# Patient Record
Sex: Female | Born: 1940 | Race: White | Hispanic: No | Smoking: Former smoker
Health system: Southern US, Community
[De-identification: ages and names within clinical notes are randomized; demographics above are authoritative.]

## PROBLEM LIST (undated history)

## (undated) DIAGNOSIS — E041 Nontoxic single thyroid nodule: Secondary | ICD-10-CM

## (undated) DIAGNOSIS — D649 Anemia, unspecified: Secondary | ICD-10-CM

## (undated) DIAGNOSIS — E785 Hyperlipidemia, unspecified: Secondary | ICD-10-CM

## (undated) DIAGNOSIS — E0590 Thyrotoxicosis, unspecified without thyrotoxic crisis or storm: Secondary | ICD-10-CM

## (undated) DIAGNOSIS — I10 Essential (primary) hypertension: Secondary | ICD-10-CM

## (undated) DIAGNOSIS — I4891 Unspecified atrial fibrillation: Secondary | ICD-10-CM

## (undated) DIAGNOSIS — IMO0002 Reserved for concepts with insufficient information to code with codable children: Secondary | ICD-10-CM

## (undated) HISTORY — DX: Nontoxic single thyroid nodule: E04.1

## (undated) HISTORY — DX: Anemia, unspecified: D64.9

## (undated) HISTORY — PX: KNEE SURGERY: SHX244

## (undated) HISTORY — PX: ABDOMINAL HYSTERECTOMY: SHX81

## (undated) HISTORY — DX: Hyperlipidemia, unspecified: E78.5

## (undated) HISTORY — PX: ABDOMINAL EXPLORATION SURGERY: SHX538

## (undated) HISTORY — PX: COLON SURGERY: SHX602

## (undated) HISTORY — PX: CHOLECYSTECTOMY: SHX55

## (undated) HISTORY — PX: INCISE AND DRAIN ABCESS: PRO64

## (undated) HISTORY — PX: DILATION AND CURETTAGE OF UTERUS: SHX78

## (undated) HISTORY — PX: APPENDECTOMY: SHX54

## (undated) HISTORY — DX: Thyrotoxicosis, unspecified without thyrotoxic crisis or storm: E05.90

## (undated) HISTORY — DX: Reserved for concepts with insufficient information to code with codable children: IMO0002

## (undated) HISTORY — DX: Essential (primary) hypertension: I10

---

## 1999-06-15 ENCOUNTER — Encounter: Payer: Self-pay | Admitting: Emergency Medicine

## 1999-06-15 ENCOUNTER — Inpatient Hospital Stay (HOSPITAL_COMMUNITY): Admission: EM | Admit: 1999-06-15 | Discharge: 1999-06-18 | Payer: Self-pay | Admitting: Emergency Medicine

## 1999-06-24 ENCOUNTER — Encounter: Admission: RE | Admit: 1999-06-24 | Discharge: 1999-08-21 | Payer: Self-pay | Admitting: Orthopedic Surgery

## 1999-11-12 ENCOUNTER — Other Ambulatory Visit: Admission: RE | Admit: 1999-11-12 | Discharge: 1999-11-12 | Payer: Self-pay | Admitting: *Deleted

## 1999-11-18 ENCOUNTER — Encounter: Payer: Self-pay | Admitting: Cardiology

## 1999-11-18 ENCOUNTER — Encounter: Admission: RE | Admit: 1999-11-18 | Discharge: 1999-11-18 | Payer: Self-pay | Admitting: Cardiology

## 2000-11-18 ENCOUNTER — Encounter: Payer: Self-pay | Admitting: *Deleted

## 2000-11-18 ENCOUNTER — Encounter: Admission: RE | Admit: 2000-11-18 | Discharge: 2000-11-18 | Payer: Self-pay | Admitting: *Deleted

## 2000-12-13 ENCOUNTER — Other Ambulatory Visit: Admission: RE | Admit: 2000-12-13 | Discharge: 2000-12-13 | Payer: Self-pay | Admitting: *Deleted

## 2001-11-21 ENCOUNTER — Encounter: Payer: Self-pay | Admitting: *Deleted

## 2001-11-21 ENCOUNTER — Encounter: Admission: RE | Admit: 2001-11-21 | Discharge: 2001-11-21 | Payer: Self-pay | Admitting: *Deleted

## 2002-02-13 ENCOUNTER — Other Ambulatory Visit: Admission: RE | Admit: 2002-02-13 | Discharge: 2002-02-13 | Payer: Self-pay | Admitting: *Deleted

## 2002-11-22 ENCOUNTER — Encounter: Payer: Self-pay | Admitting: *Deleted

## 2002-11-22 ENCOUNTER — Encounter: Admission: RE | Admit: 2002-11-22 | Discharge: 2002-11-22 | Payer: Self-pay | Admitting: *Deleted

## 2002-12-08 ENCOUNTER — Encounter: Admission: RE | Admit: 2002-12-08 | Discharge: 2002-12-08 | Payer: Self-pay | Admitting: *Deleted

## 2002-12-08 ENCOUNTER — Encounter: Payer: Self-pay | Admitting: *Deleted

## 2003-03-06 ENCOUNTER — Other Ambulatory Visit: Admission: RE | Admit: 2003-03-06 | Discharge: 2003-03-06 | Payer: Self-pay | Admitting: *Deleted

## 2003-12-17 ENCOUNTER — Encounter: Admission: RE | Admit: 2003-12-17 | Discharge: 2003-12-17 | Payer: Self-pay | Admitting: Internal Medicine

## 2004-03-17 ENCOUNTER — Other Ambulatory Visit: Admission: RE | Admit: 2004-03-17 | Discharge: 2004-03-17 | Payer: Self-pay | Admitting: *Deleted

## 2004-12-25 ENCOUNTER — Encounter: Admission: RE | Admit: 2004-12-25 | Discharge: 2004-12-25 | Payer: Self-pay | Admitting: *Deleted

## 2005-03-10 ENCOUNTER — Other Ambulatory Visit: Admission: RE | Admit: 2005-03-10 | Discharge: 2005-03-10 | Payer: Self-pay | Admitting: *Deleted

## 2006-09-21 ENCOUNTER — Other Ambulatory Visit: Admission: RE | Admit: 2006-09-21 | Discharge: 2006-09-21 | Payer: Self-pay | Admitting: *Deleted

## 2006-09-23 ENCOUNTER — Encounter: Admission: RE | Admit: 2006-09-23 | Discharge: 2006-09-23 | Payer: Self-pay | Admitting: Internal Medicine

## 2007-09-26 ENCOUNTER — Encounter: Admission: RE | Admit: 2007-09-26 | Discharge: 2007-09-26 | Payer: Self-pay | Admitting: Internal Medicine

## 2007-09-28 ENCOUNTER — Other Ambulatory Visit: Admission: RE | Admit: 2007-09-28 | Discharge: 2007-09-28 | Payer: Self-pay | Admitting: *Deleted

## 2007-11-14 ENCOUNTER — Other Ambulatory Visit: Admission: RE | Admit: 2007-11-14 | Discharge: 2007-11-14 | Payer: Self-pay | Admitting: *Deleted

## 2008-03-23 ENCOUNTER — Inpatient Hospital Stay (HOSPITAL_COMMUNITY): Admission: EM | Admit: 2008-03-23 | Discharge: 2008-03-25 | Payer: Self-pay | Admitting: Emergency Medicine

## 2008-09-26 ENCOUNTER — Encounter: Admission: RE | Admit: 2008-09-26 | Discharge: 2008-09-26 | Payer: Self-pay | Admitting: Internal Medicine

## 2008-10-01 ENCOUNTER — Other Ambulatory Visit: Admission: RE | Admit: 2008-10-01 | Discharge: 2008-10-01 | Payer: Self-pay | Admitting: Gynecology

## 2008-10-10 ENCOUNTER — Encounter: Admission: RE | Admit: 2008-10-10 | Discharge: 2008-10-10 | Payer: Self-pay | Admitting: Internal Medicine

## 2009-09-27 ENCOUNTER — Encounter: Admission: RE | Admit: 2009-09-27 | Discharge: 2009-09-27 | Payer: Self-pay | Admitting: Internal Medicine

## 2009-10-01 ENCOUNTER — Encounter: Admission: RE | Admit: 2009-10-01 | Discharge: 2009-10-01 | Payer: Self-pay | Admitting: Internal Medicine

## 2009-10-16 ENCOUNTER — Encounter: Admission: RE | Admit: 2009-10-16 | Discharge: 2009-10-16 | Payer: Self-pay | Admitting: Internal Medicine

## 2009-10-16 ENCOUNTER — Other Ambulatory Visit: Admission: RE | Admit: 2009-10-16 | Discharge: 2009-10-16 | Payer: Self-pay | Admitting: Interventional Radiology

## 2010-09-29 ENCOUNTER — Encounter: Admission: RE | Admit: 2010-09-29 | Discharge: 2010-09-29 | Payer: Self-pay | Admitting: Internal Medicine

## 2010-11-30 ENCOUNTER — Encounter: Payer: Self-pay | Admitting: Internal Medicine

## 2011-03-24 NOTE — Op Note (Signed)
NAMEJUDITHANN, Cynthia Combs               ACCOUNT NO.:  [id number]   MEDICAL RECORD NO.:  [id number]          PATIENT TYPE:  INP   LOCATION:  5034                         FACILITY:  [facility]   PHYSICIAN:  Artist Pais. Weingold, M.D.DATE OF BIRTH:  1940-12-15   DATE OF PROCEDURE:  DATE OF DISCHARGE:                               OPERATIVE REPORT   PREOPERATIVE DIAGNOSIS:  Cat bite, left hand, wrist and thumb.   POSTOPERATIVE DIAGNOSIS:  Cat bite, left hand, wrist and thumb.   ANESTHESIA:  I&D above with incision and drainage of first dorsal  compartment extensor sheath, placement of Penrose drain.   SURGEON:  Marlowe Shores, MD.   ASSISTANT:  None.   ANESTHESIA:  General.   TOURNIQUET TIME:  12 minutes.   COMPLICATIONS:  None.   DRAINS:  Two Penrose drains were placed.   OPERATIVE REPORT:  The patient was taken to operating suite.  After  induction of adequate general anesthesia, left upper extremity was  prepped and draped in sterile fashion.  An Esmarch was used to  exsanguinate, then tourniquet was inflated to 250 mmHg.  At this point  in time, 2 through-and-through  puncture wounds from the cat bite that  were over the first dorsal compartment base of thumb area were opened  using tenotomy scissors.  Purulence was encountered.  This was cultured.  There was a tract tunneling between the two, a distance of about 3 cm.  This was opened up with blunt dissection.  All devitalized tissue was  removed.  There was violation in the first dorsal compartment and  purulence in the extensor sheath.  The first dorsal compartment which  was partially released and irrigated.  After a thorough 1 liter  irrigation including 14-gauge angiocatheter to irrigate puncture wounds.  The wounds were not closed, and two Penrose drains were placed through  both these tracts.  The wound was then dressed with Xeroform, 4x4s  fluffs and a compressive wrap.  The patient tolerated the procedure  well, went  to recovery room in stable fashion.      [name], M.D.  Electronically Signed     MAW/MEDQ  D:  03/23/2008  T:  03/24/2008  Job:  [id number]

## 2011-03-24 NOTE — Consult Note (Signed)
Cynthia, Combs               ACCOUNT NO.:  [id number]   MEDICAL RECORD NO.:  [id number]          PATIENT TYPE:  INP   LOCATION:  5034                         FACILITY:  [facility]   PHYSICIAN:  Artist Pais. Weingold, M.D.DATE OF BIRTH:  [birth year]   DATE OF CONSULTATION:  DATE OF DISCHARGE:                                 CONSULTATION   HISTORY OF PRESENT ILLNESS:  Cynthia Combs is a 70 year old right-hand-  dominant female who presents today 48 hours status post cat bite by her  sister's cat on her left thumb, wrist, and hand, with pain, swelling,  and purulence, after a primary wound closure and 48 hours worth of  Augmentin.  She is 70 years old.   ALLERGIES:  She has no known drug allergies.   MEDICATIONS:  She is currently taking Toprol, Diovan, Synthroid, Lasix,  and Zocor.   PAST MEDICAL HISTORY:  She has a history significant for hypertension  and hyperthyroidism.   PAST SURGICAL HISTORY:  She has no recent hospitalizations or surgery.   FAMILY HISTORY:  Noncontributory.   SOCIAL HISTORY:  Noncontributory.   PHYSICAL EXAMINATION:  GENERAL:  Well-nourished female, pleasant, alert,  and oriented x3.  Examination her hand and wrist on left, she has pain  and swelling.  She has 2 bite wounds over the area of the radial styloid  and base of the thumb area that had purulence.  The sutures that have  been previously placed were removed by Dr. Violeta Gelinas in his office,  but there is discharge from this area.  She also has erythema up to the  mid forearm.  X-rays are pending.  Lab work is pending.   IMPRESSION:  A 70 year old female with a cat bite 48 hours ago with  primary wound closure, now with purulence, pain, and swelling despite  p.o. Augmentin.  Recommended at this point in time, she is to go to the  operating room for an I&D, culturing, admission for IV Unasyn.      [name], M.D.  Electronically Signed     MAW/MEDQ  D:  03/23/2008  T:   03/24/2008  Job:  [id number]

## 2011-08-05 LAB — DIFFERENTIAL
Basophils Absolute: 0
Basophils Relative: 0
Eosinophils Absolute: 0.1
Eosinophils Relative: 1
Lymphocytes Relative: 12
Lymphs Abs: 1.1
Monocytes Absolute: 0.5
Monocytes Relative: 6
Neutro Abs: 7.4
Neutrophils Relative %: 81 — ABNORMAL HIGH

## 2011-08-05 LAB — CULTURE, ROUTINE-ABSCESS: Culture: NO GROWTH

## 2011-08-05 LAB — WOUND CULTURE
Culture: NO GROWTH
Gram Stain: NONE SEEN

## 2011-08-05 LAB — URINALYSIS, ROUTINE W REFLEX MICROSCOPIC
Bilirubin Urine: NEGATIVE
Glucose, UA: NEGATIVE
Hgb urine dipstick: NEGATIVE
Ketones, ur: 15 — AB
Nitrite: NEGATIVE
Protein, ur: NEGATIVE
Specific Gravity, Urine: 1.01
Urobilinogen, UA: 0.2
pH: 5.5

## 2011-08-05 LAB — POCT I-STAT, CHEM 8
BUN: 9
Creatinine, Ser: 0.9
Glucose, Bld: 103 — ABNORMAL HIGH
Sodium: 134 — ABNORMAL LOW
TCO2: 28

## 2011-08-05 LAB — CBC
HCT: 38.2
Hemoglobin: 13.3
MCHC: 34.7
MCV: 91.1
Platelets: 262
RBC: 4.2
RDW: 12.1
WBC: 9.1

## 2011-08-05 LAB — PROTIME-INR
INR: 1
Prothrombin Time: 13

## 2011-08-05 LAB — ANAEROBIC CULTURE

## 2011-08-05 LAB — APTT: aPTT: 31

## 2011-08-24 ENCOUNTER — Other Ambulatory Visit: Payer: Self-pay | Admitting: Internal Medicine

## 2011-08-24 DIAGNOSIS — Z1231 Encounter for screening mammogram for malignant neoplasm of breast: Secondary | ICD-10-CM

## 2011-10-05 ENCOUNTER — Ambulatory Visit
Admission: RE | Admit: 2011-10-05 | Discharge: 2011-10-05 | Disposition: A | Discharge status: Home / Self Care | Payer: Medicare Other | Source: Ambulatory Visit | Attending: Internal Medicine | Admitting: Internal Medicine

## 2011-10-05 DIAGNOSIS — Z1231 Encounter for screening mammogram for malignant neoplasm of breast: Secondary | ICD-10-CM

## 2011-11-24 ENCOUNTER — Encounter: Payer: Self-pay | Admitting: Internal Medicine

## 2011-11-24 LAB — POC HEMOCCULT BLD/STL (HOME/3-CARD/SCREEN)

## 2011-12-28 ENCOUNTER — Encounter: Payer: Self-pay | Admitting: *Deleted

## 2012-01-05 ENCOUNTER — Ambulatory Visit (INDEPENDENT_AMBULATORY_CARE_PROVIDER_SITE_OTHER): Payer: Medicare Other | Admitting: Internal Medicine

## 2012-01-05 ENCOUNTER — Encounter: Payer: Self-pay | Admitting: Internal Medicine

## 2012-01-05 DIAGNOSIS — K589 Irritable bowel syndrome without diarrhea: Secondary | ICD-10-CM

## 2012-01-05 DIAGNOSIS — K5900 Constipation, unspecified: Secondary | ICD-10-CM

## 2012-01-05 DIAGNOSIS — R195 Other fecal abnormalities: Secondary | ICD-10-CM

## 2012-01-05 MED ORDER — PEG-KCL-NACL-NASULF-NA ASC-C 100 G PO SOLR: 1.0000 | Freq: Once | ORAL | Status: DC

## 2012-01-05 NOTE — Patient Instructions (Addendum)
You have been scheduled for a colonoscopy with propofol. Plase follow written instructions given to you at your visit today.  Please pick up your prep kit at the pharmacy today. CC: Dr Rodrigo Ran

## 2012-01-05 NOTE — Progress Notes (Signed)
Cynthia Combs [birth year] MRN [id number]   History of Present Illness:  This is a 71 year old white female who is here to discuss having a colonoscopy. She has had 2 out of 3  Stool Hemoccul cards positive for blood. She denies visible blood per rectum. Her bowel habits have been regular. She had an appointment with [place] for a colonoscopy several years ago but was never able to have it done. Her sister and her mother both had colon polyps. Her mother actually had a cancerous polyp. She has a history of extensive endometriosis necessitating a left colon resection in 1972 by Dr. Terri Piedra and another resection of the right colon in 1973 for endometrioma. She has had several pelvic surgeries for endometriosis and has been in surgical menopause at age 74. She was diagnosed with adhesions. Currently, she denies any abdominal pain, change in bowel habits or use of laxatives. She has a history of a goiter and high blood pressure. She had a flexible sigmoidoscopy by Dr.Bryan 20 years ago. She had a hemorrhoidectomy by Dr.Farley in the 1990s.    Past Medical History  Diagnosis Date  . Thyroid nodule   . Hypertension   . Hyperthyroidism   . Cyst     HX of   . Hemorrhoids   . Anemia   . Hyperlipemia    Past Surgical History  Procedure Date  . Incise and drain abcess     left hand  . Abdominal hysterectomy   . Appendectomy   . Cholecystectomy   . Abdominal exploration surgery   . Colon surgery     Due to large cyst/tumor--Benign  . Dilation and curettage of uterus   . Knee surgery     Right     reports that she has quit smoking. She has never used smokeless tobacco. She reports that she does not drink alcohol or use illicit drugs. family history includes Breast cancer in her maternal aunt; Colon polyps in her mother and sister; Heart disease in her mother; and Kidney disease in her father.  There is no history of Colon cancer. No Known Allergies      Review of Systems: denies heartburn,  dysphagia, shortness of breath or chest pain   The remainder of the 10 point ROS is negative except as outlined in H&P   Physical Exam: General appearance  Well developed, in no distress. Eyes- non icteric. HEENT nontraumatic, normocephalic. Mouth no lesions, tongue papillated, no cheilosis. Neck supple without adenopathy, thyroid not enlarged, no carotid bruits, no JVD. Lungs Clear to auscultation bilaterally. Cor normal S1, normal S2, regular rhythm, no murmur,  quiet precordium. Abdomen:  multiple surgical scars. Nontender. No palpable mass. No distention. Liver edge at costal margin.  Rectal: external hemorrhoidal tags. Normal rectal sphincter tone. Stool is solid Hemoccult-negative  Extremities no pedal edema. Skin no lesions. Neurological alert and oriented x 3. Psychological normal mood and affect.  Assessment and Plan:  Problem #1 Hemoccult-positive stool, two out of three slides on a home test. I was unable to reproduce it on today's exam but patient has a family history of colon polyps in her sister and her mother and she has never had a colonoscopy. She has a high degree of anxiety concerning  possible perforation and adhesions. We have discussed it at length today. I have discussed the safety of the procedure, the monitoring, the prep as well as the fact that we would use a pediatric instrument. Patient does realize the benefits of the colonoscopy.   01/05/2012  Lina Sar

## 2012-01-06 ENCOUNTER — Ambulatory Visit (AMBULATORY_SURGERY_CENTER): Payer: Medicare Other | Admitting: Internal Medicine

## 2012-01-06 ENCOUNTER — Encounter: Payer: Self-pay | Admitting: Internal Medicine

## 2012-01-06 VITALS — BP 160/79 | HR 67 | Temp 97.7°F | Resp 20 | Ht 72.0 in | Wt 218.0 lb

## 2012-01-06 DIAGNOSIS — D126 Benign neoplasm of colon, unspecified: Secondary | ICD-10-CM

## 2012-01-06 DIAGNOSIS — R195 Other fecal abnormalities: Secondary | ICD-10-CM

## 2012-01-06 DIAGNOSIS — K589 Irritable bowel syndrome without diarrhea: Secondary | ICD-10-CM

## 2012-01-06 MED ORDER — SODIUM CHLORIDE 0.9 % IV SOLN: 500.0000 mL | INTRAVENOUS | Status: DC

## 2012-01-06 NOTE — Progress Notes (Signed)
Patient did not have preoperative order for IV antibiotic SSI prophylaxis. (G8918)  Patient did not experience any of the following events: a burn prior to discharge; a fall within the facility; wrong site/side/patient/procedure/implant event; or a hospital transfer or hospital admission upon discharge from the facility. (G8907)  

## 2012-01-06 NOTE — Op Note (Signed)
[facility] [address] [place], [place]  [address]  COLONOSCOPY PROCEDURE REPORT  PATIENT:  Cynthia, Combs  MR#:  [id number] BIRTHDATE:  04-22-1941, 70 yrs. old  GENDER:  female ENDOSCOPIST:  Hedwig Morton. Juanda Chance, MD REF. BY:  Rodrigo Ran, M.D. PROCEDURE DATE:  01/06/2012 PROCEDURE:  Colonoscopy with biopsy and snare polypectomy ASA CLASS:  Class II INDICATIONS:  heme positive stool mother and sister with colon polyps constipation hx of severe endometriosis, s/p sihmoid resection for endometrioma in 1772 MEDICATIONS:   MAC sedation, administered by CRNA, propofol (Diprivan) 500 mg  DESCRIPTION OF PROCEDURE:   After the risks and benefits and of the procedure were explained, informed consent was obtained. Digital rectal exam was performed and revealed no rectal masses. The [id number] endoscope was introduced through the anus and advanced to the cecum, which was identified by both the appendix and ileocecal valve.  The quality of the prep was good, using MiraLax.  The instrument was then slowly withdrawn as the colon was fully examined. <<PROCEDUREIMAGES>>  FINDINGS:  A sessile polyp was found. at 35 cn 10mm sessile polyp Polyp was snared without cautery. Retrieval was successful. snare polyp The polyp was removed using cold biopsy forceps (see image1 and image4).  There was evidence of a prior segmental colectomy. in the sigmoid colon (see image6 and image5).  This was otherwise a normal examination of the colon (see image2, image3, and image7).   Retroflexed views in the rectum revealed no abnormalities.    The scope was then withdrawn from the patient and the procedure completed.  COMPLICATIONS:  None ENDOSCOPIC IMPRESSION: 1) Sessile polyp 2) Prior segmental colectomy in the sigmoid colon 3) Otherwise normal examination RECOMMENDATIONS: 1) Await pathology results 2) High fiber diet. suggest supplemental fiber or Probiotics for constipation, no significant  hemorrhoids  REPEAT EXAM:  In 10 year(s) for.  ______________________________ Hedwig Morton. Juanda Chance, MD  CC:  n. eSIGNED:   Hedwig Morton. Jakita Dutkiewicz at 01/06/2012 04:00 PM  Laray Anger, [phone]

## 2012-01-06 NOTE — Patient Instructions (Signed)
YOU HAD AN ENDOSCOPIC PROCEDURE TODAY AT THE [facility]: Refer to the procedure report that was given to you for any specific questions about what was found during the examination.  If the procedure report does not answer your questions, please call your gastroenterologist to clarify.  If you requested that your care partner not be given the details of your procedure findings, then the procedure report has been included in a sealed envelope for you to review at your convenience later.  YOU SHOULD EXPECT: Some feelings of bloating in the abdomen. Passage of more gas than usual.  Walking can help get rid of the air that was put into your GI tract during the procedure and reduce the bloating. If you had a lower endoscopy (such as a colonoscopy or flexible sigmoidoscopy) you may notice spotting of blood in your stool or on the toilet paper. If you underwent a bowel prep for your procedure, then you may not have a normal bowel movement for a few days.  DIET: Your first meal following the procedure should be a light meal and then it is ok to progress to your normal diet.  A half-sandwich or bowl of soup is an example of a good first meal.  Heavy or fried foods are harder to digest and may make you feel nauseous or bloated.  Likewise meals heavy in dairy and vegetables can cause extra gas to form and this can also increase the bloating.  Drink plenty of fluids but you should avoid alcoholic beverages for 24 hours.  ACTIVITY: Your care partner should take you home directly after the procedure.  You should plan to take it easy, moving slowly for the rest of the day.  You can resume normal activity the day after the procedure however you should NOT DRIVE or use heavy machinery for 24 hours (because of the sedation medicines used during the test).    SYMPTOMS TO REPORT IMMEDIATELY: A gastroenterologist can be reached at any hour.  During normal business hours, 8:30 AM to 5:00 PM Monday through Friday,  call [phone].  After hours and on weekends, please call the GI answering service at [phone] who will take a message and have the physician on call contact you.   Following lower endoscopy (colonoscopy or flexible sigmoidoscopy):  Excessive amounts of blood in the stool  Significant tenderness or worsening of abdominal pains  Swelling of the abdomen that is new, acute  Fever of 100F or higher  Following upper endoscopy (EGD)  Vomiting of blood or coffee ground material  New chest pain or pain under the shoulder blades  Painful or persistently difficult swallowing  New shortness of breath  Fever of 100F or higher  Black, tarry-looking stools  FOLLOW UP: If any biopsies were taken you will be contacted by phone or by letter within the next 1-3 weeks.  Call your gastroenterologist if you have not heard about the biopsies in 3 weeks.  Our staff will call the home number listed on your records the next business day following your procedure to check on you and address any questions or concerns that you may have at that time regarding the information given to you following your procedure. This is a courtesy call and so if there is no answer at the home number and we have not heard from you through the emergency physician on call, we will assume that you have returned to your regular daily activities without incident.  SIGNATURES/CONFIDENTIALITY: You and/or your care   partner have signed paperwork which will be entered into your electronic medical record.  These signatures attest to the fact that that the information above on your After Visit Summary has been reviewed and is understood.  Full responsibility of the confidentiality of this discharge information lies with you and/or your care-partner.  

## 2012-01-06 NOTE — Progress Notes (Signed)
1624patient stating she feels much better. Gas under control, abd. Soft. Assisted patient to dress and then dc.

## 2012-01-07 ENCOUNTER — Telehealth: Payer: Self-pay

## 2012-01-07 NOTE — Telephone Encounter (Signed)
Left message on answering machine. 

## 2012-01-14 ENCOUNTER — Encounter: Payer: Self-pay | Admitting: Internal Medicine

## 2012-10-14 ENCOUNTER — Other Ambulatory Visit: Payer: Self-pay | Admitting: Internal Medicine

## 2012-10-14 DIAGNOSIS — Z1231 Encounter for screening mammogram for malignant neoplasm of breast: Secondary | ICD-10-CM

## 2012-11-21 ENCOUNTER — Ambulatory Visit
Admission: RE | Admit: 2012-11-21 | Discharge: 2012-11-21 | Disposition: A | Discharge status: Home / Self Care | Payer: Medicare Other | Source: Ambulatory Visit | Attending: Internal Medicine | Admitting: Internal Medicine

## 2012-11-21 DIAGNOSIS — Z1231 Encounter for screening mammogram for malignant neoplasm of breast: Secondary | ICD-10-CM

## 2013-12-05 ENCOUNTER — Other Ambulatory Visit: Payer: Self-pay

## 2013-12-05 DIAGNOSIS — Z1231 Encounter for screening mammogram for malignant neoplasm of breast: Secondary | ICD-10-CM

## 2013-12-25 ENCOUNTER — Ambulatory Visit: Payer: Medicare Other

## 2013-12-25 ENCOUNTER — Ambulatory Visit
Admission: RE | Admit: 2013-12-25 | Discharge: 2013-12-25 | Disposition: A | Discharge status: Home / Self Care | Payer: Medicare Other | Source: Ambulatory Visit

## 2013-12-25 DIAGNOSIS — Z1231 Encounter for screening mammogram for malignant neoplasm of breast: Secondary | ICD-10-CM

## 2014-12-20 ENCOUNTER — Other Ambulatory Visit: Payer: Self-pay

## 2014-12-20 DIAGNOSIS — Z1231 Encounter for screening mammogram for malignant neoplasm of breast: Secondary | ICD-10-CM

## 2014-12-27 ENCOUNTER — Encounter (INDEPENDENT_AMBULATORY_CARE_PROVIDER_SITE_OTHER): Payer: Self-pay

## 2014-12-27 ENCOUNTER — Ambulatory Visit
Admission: RE | Admit: 2014-12-27 | Discharge: 2014-12-27 | Disposition: A | Discharge status: Home / Self Care | Payer: Medicare Other | Source: Ambulatory Visit

## 2014-12-27 DIAGNOSIS — Z1231 Encounter for screening mammogram for malignant neoplasm of breast: Secondary | ICD-10-CM

## 2015-12-09 ENCOUNTER — Other Ambulatory Visit: Payer: Self-pay

## 2015-12-09 DIAGNOSIS — Z1231 Encounter for screening mammogram for malignant neoplasm of breast: Secondary | ICD-10-CM

## 2016-01-22 ENCOUNTER — Ambulatory Visit: Payer: Medicare Other

## 2016-02-10 ENCOUNTER — Ambulatory Visit
Admission: RE | Admit: 2016-02-10 | Discharge: 2016-02-10 | Disposition: A | Discharge status: Home / Self Care | Payer: Medicare Other | Source: Ambulatory Visit

## 2016-02-10 DIAGNOSIS — Z1231 Encounter for screening mammogram for malignant neoplasm of breast: Secondary | ICD-10-CM

## 2016-12-24 ENCOUNTER — Encounter: Payer: Self-pay | Admitting: Gastroenterology

## 2017-01-04 ENCOUNTER — Other Ambulatory Visit: Payer: Self-pay | Admitting: Internal Medicine

## 2017-01-04 DIAGNOSIS — Z1231 Encounter for screening mammogram for malignant neoplasm of breast: Secondary | ICD-10-CM

## 2017-02-15 ENCOUNTER — Ambulatory Visit
Admission: RE | Admit: 2017-02-15 | Discharge: 2017-02-15 | Disposition: A | Discharge status: Home / Self Care | Payer: Medicare Other | Source: Ambulatory Visit | Attending: Internal Medicine | Admitting: Internal Medicine

## 2017-02-15 DIAGNOSIS — Z1231 Encounter for screening mammogram for malignant neoplasm of breast: Secondary | ICD-10-CM

## 2017-03-30 ENCOUNTER — Ambulatory Visit (HOSPITAL_COMMUNITY)
Admission: RE | Admit: 2017-03-30 | Discharge: 2017-03-30 | Disposition: A | Discharge status: Home / Self Care | Payer: Medicare Other | Source: Ambulatory Visit | Attending: Nurse Practitioner | Admitting: Nurse Practitioner

## 2017-03-30 ENCOUNTER — Encounter (HOSPITAL_COMMUNITY): Payer: Self-pay | Admitting: Nurse Practitioner

## 2017-03-30 VITALS — BP 148/92 | HR 88 | Wt 219.6 lb

## 2017-03-30 DIAGNOSIS — Z8249 Family history of ischemic heart disease and other diseases of the circulatory system: Secondary | ICD-10-CM | POA: Diagnosis not present

## 2017-03-30 DIAGNOSIS — D649 Anemia, unspecified: Secondary | ICD-10-CM | POA: Insufficient documentation

## 2017-03-30 DIAGNOSIS — Z87891 Personal history of nicotine dependence: Secondary | ICD-10-CM | POA: Insufficient documentation

## 2017-03-30 DIAGNOSIS — Z808 Family history of malignant neoplasm of other organs or systems: Secondary | ICD-10-CM | POA: Diagnosis not present

## 2017-03-30 DIAGNOSIS — Z79899 Other long term (current) drug therapy: Secondary | ICD-10-CM | POA: Insufficient documentation

## 2017-03-30 DIAGNOSIS — Z8371 Family history of colonic polyps: Secondary | ICD-10-CM | POA: Insufficient documentation

## 2017-03-30 DIAGNOSIS — I4891 Unspecified atrial fibrillation: Secondary | ICD-10-CM | POA: Diagnosis present

## 2017-03-30 DIAGNOSIS — Z7901 Long term (current) use of anticoagulants: Secondary | ICD-10-CM | POA: Diagnosis not present

## 2017-03-30 DIAGNOSIS — Z9049 Acquired absence of other specified parts of digestive tract: Secondary | ICD-10-CM | POA: Diagnosis not present

## 2017-03-30 DIAGNOSIS — I10 Essential (primary) hypertension: Secondary | ICD-10-CM | POA: Diagnosis not present

## 2017-03-30 DIAGNOSIS — E785 Hyperlipidemia, unspecified: Secondary | ICD-10-CM | POA: Insufficient documentation

## 2017-03-30 DIAGNOSIS — I481 Persistent atrial fibrillation: Secondary | ICD-10-CM | POA: Diagnosis not present

## 2017-03-30 DIAGNOSIS — Z803 Family history of malignant neoplasm of breast: Secondary | ICD-10-CM | POA: Insufficient documentation

## 2017-03-30 DIAGNOSIS — Z841 Family history of disorders of kidney and ureter: Secondary | ICD-10-CM | POA: Diagnosis not present

## 2017-03-30 DIAGNOSIS — Z90710 Acquired absence of both cervix and uterus: Secondary | ICD-10-CM | POA: Diagnosis not present

## 2017-03-30 DIAGNOSIS — I4819 Other persistent atrial fibrillation: Secondary | ICD-10-CM

## 2017-03-30 LAB — CBC
HCT: 38.8 % (ref 36.0–46.0)
Hemoglobin: 13 g/dL (ref 12.0–15.0)
MCH: 30.5 pg (ref 26.0–34.0)
MCHC: 33.5 g/dL (ref 30.0–36.0)
MCV: 91.1 fL (ref 78.0–100.0)
Platelets: 241 10*3/uL (ref 150–400)
RBC: 4.26 MIL/uL (ref 3.87–5.11)
RDW: 12.7 % (ref 11.5–15.5)
WBC: 7.7 10*3/uL (ref 4.0–10.5)

## 2017-03-30 LAB — BASIC METABOLIC PANEL
ANION GAP: 9 (ref 5–15)
BUN: 15 mg/dL (ref 6–20)
CALCIUM: 9.1 mg/dL (ref 8.9–10.3)
CO2: 28 mmol/L (ref 22–32)
Chloride: 97 mmol/L — ABNORMAL LOW (ref 101–111)
Creatinine, Ser: 0.91 mg/dL (ref 0.44–1.00)
GFR calc non Af Amer: >60 mL/min (ref >60)
Glucose, Bld: 101 mg/dL — ABNORMAL HIGH (ref 65–99)
Potassium: 3.7 mmol/L (ref 3.5–5.1)
Sodium: 134 mmol/L — ABNORMAL LOW (ref 135–145)

## 2017-03-30 LAB — TSH: TSH: 1.65 u[IU]/mL (ref 0.350–4.500)

## 2017-03-30 MED ORDER — APIXABAN 5 MG PO TABS: 5.0000 mg | ORAL_TABLET | Freq: Two times a day (BID) | ORAL | 0 refills | Status: DC

## 2017-03-30 NOTE — Progress Notes (Signed)
Primary Care Physician: Crist Infante, MD Referring Physician: as above   Cynthia Combs is a 76 y.o. female with a h/o HTN that presented to her PCP c/o rt chest/ flank pain, and irregular heart beat was noted. Ekg showed afib rate controlled. She was placed on eliquis 5 mg bid for chadsvasc score of 4. She was already on metoprolol but the dose was increased. She is completely asymptomatic. Has not noted any irregular heart beat in the past. Denies any fatigue or shortness of breath. She denies smoking, snoring, alcohol or tobacco use. She is fairly sedentary.  Today, she denies symptoms of palpitations, chest pain, shortness of breath, orthopnea, PND, lower extremity edema, dizziness, presyncope, syncope, or neurologic sequela. The patient is tolerating medications without difficulties and is otherwise without complaint today.   Past Medical History:  Diagnosis Date  . Anemia   . Cyst    HX of   . Hemorrhoids   . Hyperlipemia   . Hypertension   . Hyperthyroidism   . Thyroid nodule    Past Surgical History:  Procedure Laterality Date  . ABDOMINAL EXPLORATION SURGERY    . ABDOMINAL HYSTERECTOMY    . APPENDECTOMY    . CHOLECYSTECTOMY    . COLON SURGERY     Due to large cyst/tumor--Benign  . DILATION AND CURETTAGE OF UTERUS    . INCISE AND DRAIN ABCESS     left hand  . KNEE SURGERY     Right     Current Outpatient Prescriptions  Medication Sig Dispense Refill  . apixaban (ELIQUIS) 5 MG TABS tablet Take 1 tablet (5 mg total) by mouth 2 (two) times daily. 60 tablet 0  . Ascorbic Acid (VITAMIN C) 1000 MG tablet Take 1,000 mg by mouth daily.    . Calcium Carbonate-Vit D-Min (CALCIUM 1200 PO) Take 1 capsule by mouth daily.    [name] estradiol (CLIMARA - DOSED IN MG/24 HR) 0.1 mg/24hr Place 1 patch onto the skin once a week.    . folic acid (FOLVITE) 563 MCG tablet Take 400 mcg by mouth daily.    . furosemide (LASIX) 20 MG tablet Take 20 mg by mouth daily.    [name] levothyroxine  (SYNTHROID, LEVOTHROID) 75 MCG tablet Take 75 mcg by mouth daily.    . metoprolol succinate (TOPROL-XL) 100 MG 24 hr tablet Take 100 mg by mouth daily. Take with or immediately following a meal.    . metoprolol succinate (TOPROL-XL) 50 MG 24 hr tablet Take 50 mg by mouth daily. Take with or immediately following a meal.    . Niacin (VITAMIN B-3 PO) Take 1 tablet by mouth daily.    . Omega-3 Fatty Acids (FISH OIL) 1200 MG CAPS Take 1 capsule by mouth daily.    . pregabalin (LYRICA) 50 MG capsule Take 50 mg by mouth 3 (three) times daily.    . simvastatin (ZOCOR) 20 MG tablet Take 20 mg by mouth every evening.    . valACYclovir (VALTREX) 500 MG tablet Take 500 mg by mouth 3 (three) times daily.    . Valsartan (DIOVAN PO) Take 1 tablet by mouth daily.     No current facility-administered medications for this encounter.     No Known Allergies  Social History   Social History  . Marital status: Divorced    Spouse name: N/A  . Number of children: 2  . Years of education: N/A   Occupational History  . RN      Retired  Social History Main Topics  . Smoking status: Former Research scientist (life sciences)  . Smokeless tobacco: Never Used  . Alcohol use No  . Drug use: No  . Sexual activity: Not on file   Other Topics Concern  . Not on file   Social History Narrative  . No narrative on file    Family History  Problem Relation Age of Onset  . Colon polyps Mother   . Heart disease Mother   . Colon polyps Sister   . Breast cancer Maternal Aunt   . Kidney disease Father   . Colon cancer Neg Hx     ROS- All systems are reviewed and negative except as per the HPI above  Physical Exam: Vitals:   03/30/17 1347  BP: (!) 148/92  Pulse: 88  Weight: 219 lb 9.6 oz (99.6 kg)   Wt Readings from Last 3 Encounters:  03/30/17 219 lb 9.6 oz (99.6 kg)  01/06/12 218 lb (98.9 kg)  01/05/12 218 lb (98.9 kg)    Labs: Lab Results  Component Value Date   NA 134 (L) 03/30/2017   K 3.7 03/30/2017   CL 97 (L)  03/30/2017   CO2 28 03/30/2017   GLUCOSE 101 (H) 03/30/2017   BUN 15 03/30/2017   CREATININE 0.91 03/30/2017   CALCIUM 9.1 03/30/2017   Lab Results  Component Value Date   INR 1.0 03/23/2008   No results found for: CHOL, HDL, LDLCALC, TRIG   GEN- The patient is well appearing, alert and oriented x 3 today.   Head- normocephalic, atraumatic Eyes-  Sclera clear, conjunctiva pink Ears- hearing intact Oropharynx- clear Neck- supple, no JVP Lymph- no cervical lymphadenopathy Lungs- Clear to ausculation bilaterally, normal work of breathing Heart-irregular rate and rhythm, no murmurs, rubs or gallops, PMI not laterally displaced GI- soft, NT, ND, + BS Extremities- no clubbing, cyanosis, or edema MS- no significant deformity or atrophy Skin- no rash or lesion Psych- euthymic mood, full affect Neuro- strength and sensation are intact  EKG-afib at 88 bpm, qrs int 88 ms, qtc int 447 ms Epic records reviewed    Assessment and Plan: 1. New onset atrial fibrillation, asymptomatic General education re afib discussed Continue metoprolol succinate as prescribed Eliquis 5 mg bid for chadsvasc score of at least 4 Bleeding precautions discussed Echo Cbc, bmet, tsh today  2. Lifestyle issues Pt encouraged to build up a walking routine to 30 mins 5x a week Encouraged to lose 5-10% current body weight   F/u in afib clinic 2 weeks  Butch Penny C. Cathline Dowen, [facility] [address] [place], [place] [address] [phone]

## 2017-04-13 ENCOUNTER — Ambulatory Visit (HOSPITAL_COMMUNITY)
Admission: RE | Admit: 2017-04-13 | Discharge: 2017-04-13 | Disposition: A | Discharge status: Home / Self Care | Payer: Medicare Other | Source: Ambulatory Visit | Attending: Nurse Practitioner | Admitting: Nurse Practitioner

## 2017-04-13 DIAGNOSIS — I481 Persistent atrial fibrillation: Secondary | ICD-10-CM | POA: Insufficient documentation

## 2017-04-13 DIAGNOSIS — I4891 Unspecified atrial fibrillation: Secondary | ICD-10-CM | POA: Diagnosis present

## 2017-04-13 DIAGNOSIS — E785 Hyperlipidemia, unspecified: Secondary | ICD-10-CM | POA: Insufficient documentation

## 2017-04-13 DIAGNOSIS — I119 Hypertensive heart disease without heart failure: Secondary | ICD-10-CM | POA: Diagnosis not present

## 2017-04-13 DIAGNOSIS — I4819 Other persistent atrial fibrillation: Secondary | ICD-10-CM

## 2017-04-13 LAB — ECHOCARDIOGRAM COMPLETE
FS: 31 % (ref 28–44)
IVS/LV PW RATIO, ED: 1.08
LA ID, A-P, ES: 34 mm
LA diam end sys: 34 mm
LA diam index: 1.53 cm/m2
LA vol A4C: 62.3 ml
LA vol index: 32.8 mL/m2
LAVOL: 72.9 mL
LDCA: 3.14 cm2
LV PW d: 11.7 mm — AB (ref 0.6–1.1)
LV SIMPSON'S DISK: 57
LV dias vol index: 36 mL/m2
LV dias vol: 80 mL (ref 46–106)
LV sys vol index: 16 mL/m2
LV sys vol: 35 mL (ref 14–42)
LVOT VTI: 18.5 cm
LVOT diameter: 20 mm
LVOT peak grad rest: 4 mmHg
LVOT peak vel: 106 cm/s
LVOTSV: 58 mL
Lateral S' vel: 11 cm/s
PV Reg grad dias: 9 mmHg
PV Reg vel dias: 148 cm/s
RV sys press: 29 mmHg
Reg peak vel: 256 cm/s
Stroke v: 45 ml
TAPSE: 16.9 mm
TRMAXVEL: 256 cm/s

## 2017-04-13 NOTE — Progress Notes (Signed)
  Echocardiogram 2D Echocardiogram has been performed.  Sanaii Caporaso T Icey Tello 04/13/2017, 3:47 PM

## 2017-04-19 ENCOUNTER — Ambulatory Visit (HOSPITAL_COMMUNITY)
Admission: RE | Admit: 2017-04-19 | Discharge: 2017-04-19 | Disposition: A | Discharge status: Home / Self Care | Payer: Medicare Other | Source: Ambulatory Visit | Attending: Nurse Practitioner | Admitting: Nurse Practitioner

## 2017-04-19 ENCOUNTER — Encounter (HOSPITAL_COMMUNITY): Payer: Self-pay | Admitting: Nurse Practitioner

## 2017-04-19 VITALS — BP 162/92 | HR 82 | Ht 72.0 in | Wt 215.4 lb

## 2017-04-19 DIAGNOSIS — E0590 Thyrotoxicosis, unspecified without thyrotoxic crisis or storm: Secondary | ICD-10-CM | POA: Diagnosis not present

## 2017-04-19 DIAGNOSIS — Z7901 Long term (current) use of anticoagulants: Secondary | ICD-10-CM | POA: Diagnosis not present

## 2017-04-19 DIAGNOSIS — I481 Persistent atrial fibrillation: Secondary | ICD-10-CM | POA: Diagnosis not present

## 2017-04-19 DIAGNOSIS — I10 Essential (primary) hypertension: Secondary | ICD-10-CM | POA: Insufficient documentation

## 2017-04-19 DIAGNOSIS — I4819 Other persistent atrial fibrillation: Secondary | ICD-10-CM

## 2017-04-19 DIAGNOSIS — E785 Hyperlipidemia, unspecified: Secondary | ICD-10-CM | POA: Insufficient documentation

## 2017-04-19 DIAGNOSIS — Z87891 Personal history of nicotine dependence: Secondary | ICD-10-CM | POA: Insufficient documentation

## 2017-04-19 DIAGNOSIS — I4891 Unspecified atrial fibrillation: Secondary | ICD-10-CM | POA: Diagnosis present

## 2017-04-19 NOTE — Progress Notes (Signed)
Primary Care Physician: Crist Infante, MD Referring Physician: as above   Cynthia Combs is a 76 y.o. female with a h/o HTN that presented to her PCP c/o rt chest/ flank pain, and irregular heart beat was noted. Ekg showed afib rate controlled. She was placed on eliquis 5 mg bid for chadsvasc score of 4. She was already on metoprolol but the dose was increased. She is completely asymptomatic. Has not noted any irregular heart beat in the past. Denies any fatigue or shortness of breath. She denies smoking, snoring, alcohol or tobacco use. She is fairly sedentary.  F/u in afib clinic. She continues on rate control drugs and is well rate controlled. She is on apixaban without issues. She continues to feel well and is totally unaware that she has afib.  Today, she denies symptoms of palpitations, chest pain, shortness of breath, orthopnea, PND, lower extremity edema, dizziness, presyncope, syncope, or neurologic sequela. The patient is tolerating medications without difficulties and is otherwise without complaint today.   Past Medical History:  Diagnosis Date  . Anemia   . Cyst    HX of   . Hemorrhoids   . Hyperlipemia   . Hypertension   . Hyperthyroidism   . Thyroid nodule    Past Surgical History:  Procedure Laterality Date  . ABDOMINAL EXPLORATION SURGERY    . ABDOMINAL HYSTERECTOMY    . APPENDECTOMY    . CHOLECYSTECTOMY    . COLON SURGERY     Due to large cyst/tumor--Benign  . DILATION AND CURETTAGE OF UTERUS    . INCISE AND DRAIN ABCESS     left hand  . KNEE SURGERY     Right     Current Outpatient Prescriptions  Medication Sig Dispense Refill  . apixaban (ELIQUIS) 5 MG TABS tablet Take 1 tablet (5 mg total) by mouth 2 (two) times daily. 60 tablet 0  . Ascorbic Acid (VITAMIN C) 1000 MG tablet Take 1,000 mg by mouth daily.    . Calcium Carbonate-Vit D-Min (CALCIUM 1200 PO) Take 1 capsule by mouth daily.    [name] estradiol (ESTRACE) 0.5 MG tablet Take 0.5 mg by mouth 3 (three)  times a week.    . folic acid (FOLVITE) 116 MCG tablet Take 400 mcg by mouth daily.    . furosemide (LASIX) 20 MG tablet Take 20 mg by mouth daily.    [name] levothyroxine (SYNTHROID, LEVOTHROID) 75 MCG tablet Take 75 mcg by mouth daily.    . metoprolol succinate (TOPROL-XL) 100 MG 24 hr tablet Take 100 mg by mouth daily. Take with or immediately following a meal.    . metoprolol succinate (TOPROL-XL) 50 MG 24 hr tablet Take 50 mg by mouth daily. Take with or immediately following a meal.    . simvastatin (ZOCOR) 20 MG tablet Take 20 mg by mouth every evening.    . valsartan-hydrochlorothiazide (DIOVAN-HCT) 160-12.5 MG tablet Take 1 tablet by mouth daily.    . valACYclovir (VALTREX) 500 MG tablet Take 500 mg by mouth 3 (three) times daily.     No current facility-administered medications for this encounter.     No Known Allergies  Social History   Social History  . Marital status: Divorced    Spouse name: N/A  . Number of children: 2  . Years of education: N/A   Occupational History  . RN      Retired    Social History Main Topics  . Smoking status: Former Research scientist (life sciences)  . Smokeless tobacco: Never Used  .  Alcohol use No  . Drug use: No  . Sexual activity: Not on file   Other Topics Concern  . Not on file   Social History Narrative  . No narrative on file    Family History  Problem Relation Age of Onset  . Colon polyps Mother   . Heart disease Mother   . Colon polyps Sister   . Breast cancer Maternal Aunt   . Kidney disease Father   . Colon cancer Neg Hx     ROS- All systems are reviewed and negative except as per the HPI above  Physical Exam: Vitals:   04/19/17 1508  BP: (!) 162/92  Pulse: 82  Weight: 215 lb 6.4 oz (97.7 kg)  Height: 6' (1.829 m)   Wt Readings from Last 3 Encounters:  04/19/17 215 lb 6.4 oz (97.7 kg)  03/30/17 219 lb 9.6 oz (99.6 kg)  01/06/12 218 lb (98.9 kg)    Labs: Lab Results  Component Value Date   NA 134 (L) 03/30/2017   K 3.7  03/30/2017   CL 97 (L) 03/30/2017   CO2 28 03/30/2017   GLUCOSE 101 (H) 03/30/2017   BUN 15 03/30/2017   CREATININE 0.91 03/30/2017   CALCIUM 9.1 03/30/2017   Lab Results  Component Value Date   INR 1.0 03/23/2008   No results found for: CHOL, HDL, LDLCALC, TRIG   GEN- The patient is well appearing, alert and oriented x 3 today.   Head- normocephalic, atraumatic Eyes-  Sclera clear, conjunctiva pink Ears- hearing intact Oropharynx- clear Neck- supple, no JVP Lymph- no cervical lymphadenopathy Lungs- Clear to ausculation bilaterally, normal work of breathing Heart-irregular rate and rhythm, no murmurs, rubs or gallops, PMI not laterally displaced GI- soft, NT, ND, + BS Extremities- no clubbing, cyanosis, or edema MS- no significant deformity or atrophy Skin- no rash or lesion Psych- euthymic mood, full affect Neuro- strength and sensation are intact  EKG-afib at 82 bpm, qrs int 88 ms, qtc 446 Epic records reviewed Echo-Study Conclusions  - Left ventricle: The cavity size was normal. There was mild   concentric hypertrophy. Systolic function was vigorous. The   estimated ejection fraction was in the range of 65% to 70%. Wall   motion was normal; there were no regional wall motion   abnormalities. - Left atrium: The atrium was at the upper limits of normal in   size. - Right atrium: The atrium was mildly dilated.  Assessment and Plan: 1. New onset atrial fibrillation, asymptomatic Continue metoprolol succinate as prescribed, weell rate controlled Eliquis 5 mg bid for chadsvasc score of at least 4, no bleeding issues echo reviewed Cardioversion offered but pt found out that her father and aunt both had afib and both  failed cardioversion and they both would not shock out and went on to die of other causes Since she is asymptomatic, she would rather not have cardioversion With normal EF and being asymptomatic, I feel that this resonable   2. Lifestyle issues Pt  encouraged to build up a walking routine to 30 mins 5x a week Encouraged to lose 5-10% current body weight   F/u in afib clinic in one month to see if she still is rate controlled and wants to carry on with this plan of living in [place]. Cynthia Combs, [facility] [address] [place], [place] [address] [phone]

## 2017-05-24 ENCOUNTER — Ambulatory Visit (HOSPITAL_COMMUNITY)
Admission: RE | Admit: 2017-05-24 | Discharge: 2017-05-24 | Disposition: A | Discharge status: Home / Self Care | Payer: Medicare Other | Source: Ambulatory Visit | Attending: Nurse Practitioner | Admitting: Nurse Practitioner

## 2017-05-24 ENCOUNTER — Encounter (HOSPITAL_COMMUNITY): Payer: Self-pay | Admitting: Nurse Practitioner

## 2017-05-24 VITALS — BP 148/84 | HR 94 | Ht 72.0 in | Wt 218.0 lb

## 2017-05-24 DIAGNOSIS — I10 Essential (primary) hypertension: Secondary | ICD-10-CM | POA: Insufficient documentation

## 2017-05-24 DIAGNOSIS — Z79899 Other long term (current) drug therapy: Secondary | ICD-10-CM | POA: Insufficient documentation

## 2017-05-24 DIAGNOSIS — Z8249 Family history of ischemic heart disease and other diseases of the circulatory system: Secondary | ICD-10-CM | POA: Insufficient documentation

## 2017-05-24 DIAGNOSIS — Z87891 Personal history of nicotine dependence: Secondary | ICD-10-CM | POA: Diagnosis not present

## 2017-05-24 DIAGNOSIS — Z8371 Family history of colonic polyps: Secondary | ICD-10-CM | POA: Diagnosis not present

## 2017-05-24 DIAGNOSIS — E0590 Thyrotoxicosis, unspecified without thyrotoxic crisis or storm: Secondary | ICD-10-CM | POA: Diagnosis not present

## 2017-05-24 DIAGNOSIS — I4819 Other persistent atrial fibrillation: Secondary | ICD-10-CM

## 2017-05-24 DIAGNOSIS — Z7901 Long term (current) use of anticoagulants: Secondary | ICD-10-CM | POA: Insufficient documentation

## 2017-05-24 DIAGNOSIS — E785 Hyperlipidemia, unspecified: Secondary | ICD-10-CM | POA: Insufficient documentation

## 2017-05-24 DIAGNOSIS — I481 Persistent atrial fibrillation: Secondary | ICD-10-CM | POA: Diagnosis not present

## 2017-05-24 DIAGNOSIS — Z9889 Other specified postprocedural states: Secondary | ICD-10-CM | POA: Insufficient documentation

## 2017-05-24 DIAGNOSIS — Z803 Family history of malignant neoplasm of breast: Secondary | ICD-10-CM | POA: Diagnosis not present

## 2017-05-24 DIAGNOSIS — Z800 Family history of malignant neoplasm of digestive organs: Secondary | ICD-10-CM | POA: Insufficient documentation

## 2017-05-24 DIAGNOSIS — Z9049 Acquired absence of other specified parts of digestive tract: Secondary | ICD-10-CM | POA: Diagnosis not present

## 2017-05-24 NOTE — Progress Notes (Signed)
Primary Care Physician: Crist Infante, MD Referring Physician: as above   Cynthia Combs is a 76 y.o. female with a h/o HTN that presented to her PCP c/o rt chest/ flank pain, and irregular heart beat was noted. Ekg showed afib rate controlled. She was placed on eliquis 5 mg bid for chadsvasc score of 4. She was already on metoprolol but the dose was increased. She is completely asymptomatic. Has not noted any irregular heart beat in the past. Denies any fatigue or shortness of breath. She denies smoking, snoring, alcohol or tobacco use. She is fairly sedentary.  F/u in afib clinic. She continues on rate control drugs and is well rate controlled. She is on apixaban without issues. She continues to feel well and is totally unaware that she has afib.  Returns to afib clinic, 716. She has continued in afib, rate controlled for the last month and wants to stay in rate controlled afib. She states that she is asymptomatic and does not want any further treatment. I discussed referring her on to general cardiology but she would like to discuss with pcp first. She is doing well on eliquis without bleeding issues.  Today, she denies symptoms of palpitations, chest pain, shortness of breath, orthopnea, PND, lower extremity edema, dizziness, presyncope, syncope, or neurologic sequela. The patient is tolerating medications without difficulties and is otherwise without complaint today.   Past Medical History:  Diagnosis Date  . Anemia   . Cyst    HX of   . Hemorrhoids   . Hyperlipemia   . Hypertension   . Hyperthyroidism   . Thyroid nodule    Past Surgical History:  Procedure Laterality Date  . ABDOMINAL EXPLORATION SURGERY    . ABDOMINAL HYSTERECTOMY    . APPENDECTOMY    . CHOLECYSTECTOMY    . COLON SURGERY     Due to large cyst/tumor--Benign  . DILATION AND CURETTAGE OF UTERUS    . INCISE AND DRAIN ABCESS     left hand  . KNEE SURGERY     Right     Current Outpatient Prescriptions    Medication Sig Dispense Refill  . apixaban (ELIQUIS) 5 MG TABS tablet Take 1 tablet (5 mg total) by mouth 2 (two) times daily. 60 tablet 0  . Ascorbic Acid (VITAMIN C) 1000 MG tablet Take 1,000 mg by mouth daily.    . Calcium Carbonate-Vit D-Min (CALCIUM 1200 PO) Take 1 capsule by mouth daily.    [name] estradiol (ESTRACE) 0.5 MG tablet Take 0.5 mg by mouth 3 (three) times a week.    . folic acid (FOLVITE) 546 MCG tablet Take 400 mcg by mouth daily.    . furosemide (LASIX) 20 MG tablet Take 20 mg by mouth daily.    [name] levothyroxine (SYNTHROID, LEVOTHROID) 75 MCG tablet Take 75 mcg by mouth daily.    . metoprolol succinate (TOPROL-XL) 100 MG 24 hr tablet Take 100 mg by mouth daily. Take with or immediately following a meal.    . metoprolol succinate (TOPROL-XL) 50 MG 24 hr tablet Take 50 mg by mouth daily. Take with or immediately following a meal.    . simvastatin (ZOCOR) 20 MG tablet Take 20 mg by mouth every evening.    . valsartan-hydrochlorothiazide (DIOVAN-HCT) 160-12.5 MG tablet Take 1 tablet by mouth daily.     No current facility-administered medications for this encounter.     No Known Allergies  Social History   Social History  . Marital status: Divorced  Spouse name: N/A  . Number of children: 2  . Years of education: N/A   Occupational History  . RN      Retired    Social History Main Topics  . Smoking status: Former Research scientist (life sciences)  . Smokeless tobacco: Never Used  . Alcohol use No  . Drug use: No  . Sexual activity: Not on file   Other Topics Concern  . Not on file   Social History Narrative  . No narrative on file    Family History  Problem Relation Age of Onset  . Colon polyps Mother   . Heart disease Mother   . Colon polyps Sister   . Breast cancer Maternal Aunt   . Kidney disease Father   . Colon cancer Neg Hx     ROS- All systems are reviewed and negative except as per the HPI above  Physical Exam: Vitals:   05/24/17 1503  BP: (!) 148/84  Pulse: 94   Weight: 218 lb (98.9 kg)  Height: 6' (1.829 m)   Wt Readings from Last 3 Encounters:  05/24/17 218 lb (98.9 kg)  04/19/17 215 lb 6.4 oz (97.7 kg)  03/30/17 219 lb 9.6 oz (99.6 kg)    Labs: Lab Results  Component Value Date   NA 134 (L) 03/30/2017   K 3.7 03/30/2017   CL 97 (L) 03/30/2017   CO2 28 03/30/2017   GLUCOSE 101 (H) 03/30/2017   BUN 15 03/30/2017   CREATININE 0.91 03/30/2017   CALCIUM 9.1 03/30/2017   Lab Results  Component Value Date   INR 1.0 03/23/2008   No results found for: CHOL, HDL, LDLCALC, TRIG   GEN- The patient is well appearing, alert and oriented x 3 today.   Head- normocephalic, atraumatic Eyes-  Sclera clear, conjunctiva pink Ears- hearing intact Oropharynx- clear Neck- supple, no JVP Lymph- no cervical lymphadenopathy Lungs- Clear to ausculation bilaterally, normal work of breathing Heart-irregular rate and rhythm, no murmurs, rubs or gallops, PMI not laterally displaced GI- soft, NT, ND, + BS Extremities- no clubbing, cyanosis, or edema MS- no significant deformity or atrophy Skin- no rash or lesion Psych- euthymic mood, full affect Neuro- strength and sensation are intact  EKG-afib at 94 bpm  Epic records reviewed Echo- 04/13/17-Study Conclusions  - Left ventricle: The cavity size was normal. There was mild   concentric hypertrophy. Systolic function was vigorous. The   estimated ejection fraction was in the range of 65% to 70%. Wall   motion was normal; there were no regional wall motion   abnormalities. - Left atrium: The atrium was at the upper limits of normal in   size. - Right atrium: The atrium was mildly dilated.  Assessment and Plan: 1. New onset  persistent atrial fibrillation, asymptomatic Continue metoprolol succinate as prescribed, well rate controlled Eliquis 5 mg bid for chadsvasc score of at least 4, no bleeding issues Echo reviewed with pt Cardioversion prevously offered but pt found out that her father and  aunt both had afib and both  failed cardioversion and they both would not shock out and went on to die of other causes Since she is asymptomatic, she would rather not have cardioversion With normal EF and being asymptomatic, I feel that this reasonable Since she will be living in rate controlled  afib mentioned that it might be good to establish with general cardiology for future f/u, she would like to ask PCP for his opinion for a name   2. Lifestyle issues Pt encouraged  to build up a walking routine to 30 mins 5x a week   She has pending physical with Dr. Joylene Draft 8/1 and she would like to ask his opinion for f/u of general cardiology Otherwise, no changes today, afib clinic as needed  Butch Penny C. Cailen Mihalik, [facility] [address] [place], [place] [address] [phone]

## 2018-01-10 ENCOUNTER — Other Ambulatory Visit: Payer: Self-pay | Admitting: Internal Medicine

## 2018-01-10 DIAGNOSIS — Z1231 Encounter for screening mammogram for malignant neoplasm of breast: Secondary | ICD-10-CM

## 2018-02-21 ENCOUNTER — Ambulatory Visit
Admission: RE | Admit: 2018-02-21 | Discharge: 2018-02-21 | Disposition: A | Discharge status: Home / Self Care | Payer: Medicare Other | Source: Ambulatory Visit | Attending: Internal Medicine | Admitting: Internal Medicine

## 2018-02-21 DIAGNOSIS — Z1231 Encounter for screening mammogram for malignant neoplasm of breast: Secondary | ICD-10-CM

## 2018-04-06 ENCOUNTER — Emergency Department (HOSPITAL_COMMUNITY)
Admission: EM | Admit: 2018-04-06 | Discharge: 2018-04-06 | Disposition: A | Discharge status: Home / Self Care | Payer: Medicare Other | Attending: Emergency Medicine | Admitting: Emergency Medicine

## 2018-04-06 ENCOUNTER — Other Ambulatory Visit: Payer: Self-pay

## 2018-04-06 ENCOUNTER — Emergency Department (HOSPITAL_COMMUNITY): Payer: Medicare Other

## 2018-04-06 ENCOUNTER — Encounter (HOSPITAL_COMMUNITY): Payer: Self-pay | Admitting: Emergency Medicine

## 2018-04-06 DIAGNOSIS — Z7901 Long term (current) use of anticoagulants: Secondary | ICD-10-CM | POA: Insufficient documentation

## 2018-04-06 DIAGNOSIS — D332 Benign neoplasm of brain, unspecified: Secondary | ICD-10-CM | POA: Diagnosis not present

## 2018-04-06 DIAGNOSIS — Z87891 Personal history of nicotine dependence: Secondary | ICD-10-CM | POA: Insufficient documentation

## 2018-04-06 DIAGNOSIS — E0590 Thyrotoxicosis, unspecified without thyrotoxic crisis or storm: Secondary | ICD-10-CM | POA: Diagnosis not present

## 2018-04-06 DIAGNOSIS — I480 Paroxysmal atrial fibrillation: Secondary | ICD-10-CM | POA: Insufficient documentation

## 2018-04-06 DIAGNOSIS — I10 Essential (primary) hypertension: Secondary | ICD-10-CM | POA: Insufficient documentation

## 2018-04-06 DIAGNOSIS — Z79899 Other long term (current) drug therapy: Secondary | ICD-10-CM | POA: Diagnosis not present

## 2018-04-06 DIAGNOSIS — J029 Acute pharyngitis, unspecified: Secondary | ICD-10-CM | POA: Insufficient documentation

## 2018-04-06 DIAGNOSIS — D333 Benign neoplasm of cranial nerves: Secondary | ICD-10-CM

## 2018-04-06 DIAGNOSIS — I4891 Unspecified atrial fibrillation: Secondary | ICD-10-CM

## 2018-04-06 LAB — CBC
HEMATOCRIT: 34.9 % — AB (ref 36.0–46.0)
HEMOGLOBIN: 11 g/dL — AB (ref 12.0–15.0)
MCH: 26.4 pg (ref 26.0–34.0)
MCHC: 31.5 g/dL (ref 30.0–36.0)
MCV: 83.9 fL (ref 78.0–100.0)
Platelets: 526 10*3/uL — ABNORMAL HIGH (ref 150–400)
RBC: 4.16 MIL/uL (ref 3.87–5.11)
RDW: 15.5 % (ref 11.5–15.5)
WBC: 14.8 10*3/uL — AB (ref 4.0–10.5)

## 2018-04-06 LAB — BASIC METABOLIC PANEL
ANION GAP: 10 (ref 5–15)
BUN: 12 mg/dL (ref 6–20)
CHLORIDE: 90 mmol/L — AB (ref 101–111)
CO2: 31 mmol/L (ref 22–32)
Calcium: 8.8 mg/dL — ABNORMAL LOW (ref 8.9–10.3)
Creatinine, Ser: 0.78 mg/dL (ref 0.44–1.00)
GFR calc Af Amer: >60 mL/min (ref >60)
GFR calc non Af Amer: >60 mL/min (ref >60)
Glucose, Bld: 114 mg/dL — ABNORMAL HIGH (ref 65–99)
POTASSIUM: 3.4 mmol/L — AB (ref 3.5–5.1)
Sodium: 131 mmol/L — ABNORMAL LOW (ref 135–145)

## 2018-04-06 MED ORDER — METOPROLOL TARTRATE 5 MG/5ML IV SOLN
5.0000 mg | Freq: Once | INTRAVENOUS | Status: AC
  Administered 2018-04-06: 5 mg via INTRAVENOUS
  Filled 2018-04-06: qty 5

## 2018-04-06 MED ORDER — SODIUM CHLORIDE 0.9 % IV BOLUS
500.0000 mL | Freq: Once | INTRAVENOUS | Status: AC
Start: 2018-04-06 — End: 2018-04-06
  Administered 2018-04-06: 500 mL via INTRAVENOUS

## 2018-04-06 MED ORDER — SODIUM CHLORIDE 0.9 % IV BOLUS: 1000.0000 mL | Freq: Once | INTRAVENOUS | Status: DC

## 2018-04-06 MED ORDER — IOHEXOL 300 MG/ML  SOLN
100.0000 mL | Freq: Once | INTRAMUSCULAR | Status: AC
  Administered 2018-04-06: 75 mL via INTRAVENOUS

## 2018-04-06 NOTE — ED Triage Notes (Addendum)
Patient to ED c/o sore throat x 6 days - was sent by PCP for CT of her neck. Patient reports pain with swallowing, inability to eat/drink, and "something in her throat." Patient denies fevers/chills, but has been treated with antibiotic. Voice sounds muffled, but airway intact. Resp e/u. Patient also endorses hx A-Fib, rate fast in triage, EKG done. Patient denies CP/SOB.

## 2018-04-06 NOTE — Discharge Instructions (Addendum)
Please read and follow all provided instructions.  Your diagnoses today include:  1. 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 Atrial fibrillation with RVR ([facility])     Tests performed today include:  Blood counts and electrolytes -shows elevated white blood cells  CT of your neck -does not show any trouble in the neck other than swollen lymph node, as we discussed we see a 3 cm mass that may be a meningioma or an acoustic neuroma.  This is not an emergency that requires admission to the hospital today, but will need to be followed up by your primary care physician in the near future  Vital signs. See below for your results today.   Medications prescribed:   None  Take any prescribed medications only as directed.  Home care instructions:  Follow any educational materials contained in this packet.  Please continue home medications including metoprolol for heart rate control.  Follow-up instructions: Please follow-up with your primary care provider in the next 2 days for further evaluation of your symptoms.   Return instructions:   Please return to the Emergency Department if you experience worsening symptoms.   Return to the emergency department with high fever, if you cannot swallow, if you develop chest pain or shortness of breath, or new symptoms.  Please return if you have any other emergent concerns.  Additional Information:  Your vital signs today were: BP (!) 142/104    Pulse (!) 106    Temp 98.2 F (36.8 C) (Oral)    Resp 15    SpO2 100%  If your blood pressure (BP) was elevated above 135/85 this visit, please have this repeated by your doctor within one month. --------------

## 2018-04-06 NOTE — ED Provider Notes (Addendum)
[facility] EMERGENCY DEPARTMENT Provider Note   CSN: [id number] Arrival date & time: 04/06/18  1006     History   Chief Complaint Chief Complaint  Patient presents with  . Sore Throat    HPI Cynthia Combs is a 77 y.o. female.  Patient with history of chronic atrial fibrillation on apixaban and metoprolol, hypothyroidism, on Lasix daily due to dependent edema --presents with ongoing sore throat.  Patient was seen initially and started on a course of steroids and clindamycin.  She reports having a positive strep culture.  She went back for follow-up today because her symptoms were not improving.  She was given IM Rocephin and asked to go to the emergency department for imaging to rule out abscess.  Patient denies having any fevers.  She is able to swallow and handle her secretions.  She does not have pain with movement of her neck.  She denies any vision changes or noticeable hearing changes.  She states that her family states that she cannot hear sometimes.  No nausea, vomiting, or diarrhea.  Patient has been compliant with her medications.     Past Medical History:  Diagnosis Date  . Anemia   . Cyst    HX of   . Hemorrhoids   . Hyperlipemia   . Hypertension   . Hyperthyroidism   . Thyroid nodule     There are no active problems to display for this patient.   Past Surgical History:  Procedure Laterality Date  . ABDOMINAL EXPLORATION SURGERY    . ABDOMINAL HYSTERECTOMY    . APPENDECTOMY    . CHOLECYSTECTOMY    . COLON SURGERY     Due to large cyst/tumor--Benign  . DILATION AND CURETTAGE OF UTERUS    . INCISE AND DRAIN ABCESS     left hand  . KNEE SURGERY     Right      OB History   None      Home Medications    Prior to Admission medications   Medication Sig Start Date End Date Taking? Authorizing Provider  apixaban (ELIQUIS) 5 MG TABS tablet Take 1 tablet (5 mg total) by mouth 2 (two) times daily. 03/30/17   Sherran Needs, NP    Ascorbic Acid (VITAMIN C) 1000 MG tablet Take 1,000 mg by mouth daily.    [provider]  Calcium Carbonate-Vit D-Min (CALCIUM 1200 PO) Take 1 capsule by mouth daily.    [provider]  estradiol (ESTRACE) 0.5 MG tablet Take 0.5 mg by mouth 3 (three) times a week.    [provider]  folic acid (FOLVITE) 211 MCG tablet Take 400 mcg by mouth daily.    [provider]  furosemide (LASIX) 20 MG tablet Take 20 mg by mouth daily.    [provider]  levothyroxine (SYNTHROID, LEVOTHROID) 75 MCG tablet Take 75 mcg by mouth daily.    [provider]  metoprolol succinate (TOPROL-XL) 100 MG 24 hr tablet Take 100 mg by mouth daily. Take with or immediately following a meal.    [provider]  metoprolol succinate (TOPROL-XL) 50 MG 24 hr tablet Take 50 mg by mouth daily. Take with or immediately following a meal.    [provider]  simvastatin (ZOCOR) 20 MG tablet Take 20 mg by mouth every evening.    [provider]  valsartan-hydrochlorothiazide (DIOVAN-HCT) 160-12.5 MG tablet Take 1 tablet by mouth daily.    [provider]    [facility]  History Family History  Problem Relation Age of Onset  . Colon polyps Mother   . Heart disease Mother   . Colon polyps Sister   . Breast cancer Maternal Aunt   . Kidney disease Father   . Colon cancer Neg Hx     Social History Social History   Tobacco Use  . Smoking status: Former Research scientist (life sciences)  . Smokeless tobacco: Never Used  Substance Use Topics  . Alcohol use: No  . Drug use: No     Allergies   Patient has no known allergies.   Review of Systems Review of Systems  Constitutional: Negative for fever.  HENT: Positive for sore throat. Negative for ear discharge, ear pain, rhinorrhea and tinnitus.   Eyes: Negative for redness.  Respiratory: Negative for cough.   Cardiovascular: Negative for chest pain.  Gastrointestinal: Negative for abdominal pain, diarrhea,  nausea and vomiting.  Genitourinary: Negative for dysuria.  Musculoskeletal: Negative for myalgias.  Skin: Negative for rash.  Neurological: Negative for headaches.     Physical Exam Updated Vital Signs BP (!) 140/95   Pulse (!) 112   Temp 98.2 F (36.8 C) (Oral)   Resp 20   SpO2 100%   Physical Exam  Constitutional: She appears well-developed and well-nourished.  HENT:  Head: Normocephalic and atraumatic.  Right Ear: Tympanic membrane and ear canal normal.  Left Ear: Tympanic membrane and ear canal normal.  Nose: No mucosal edema or rhinorrhea.  Mouth/Throat: Uvula is midline and mucous membranes are normal. Posterior oropharyngeal erythema present. No oropharyngeal exudate, posterior oropharyngeal edema or tonsillar abscesses.  Eyes: Conjunctivae are normal. Right eye exhibits no discharge. Left eye exhibits no discharge.  Neck: Normal range of motion. Neck supple.  Cardiovascular: Normal heart sounds. An irregularly irregular rhythm present. Tachycardia present.  No murmur heard. Pulmonary/Chest: Effort normal and breath sounds normal. No respiratory distress. She has no wheezes. She has no rales.  Abdominal: Soft. There is no tenderness. There is no rebound and no guarding.  Neurological: She is alert.  Skin: Skin is warm and dry.  Psychiatric: She has a normal mood and affect.  Nursing note and vitals reviewed.    ED Treatments / Results  Labs (all labs ordered are listed, but only abnormal results are displayed) Labs Reviewed  CBC - Abnormal; Notable for the following components:      Result Value   WBC 14.8 (*)    Hemoglobin 11.0 (*)    HCT 34.9 (*)    Platelets 526 (*)    All other components within normal limits  BASIC METABOLIC PANEL - Abnormal; Notable for the following components:   Sodium 131 (*)    Potassium 3.4 (*)    Chloride 90 (*)    Glucose, Bld 114 (*)    Calcium 8.8 (*)    All other components within normal limits     EKG None  Radiology Ct Soft Tissue Neck W Contrast  Result Date: 04/06/2018 CLINICAL DATA:  Bilateral neck swelling.  Strep throat. EXAM: CT NECK WITH CONTRAST TECHNIQUE: Multidetector CT imaging of the neck was performed using the standard protocol following the bolus administration of intravenous contrast. CONTRAST:  53mL OMNIPAQUE IOHEXOL 300 MG/ML  SOLN COMPARISON:  Thyroid ultrasound 10/01/2009 FINDINGS: The study is mildly motion degraded. Pharynx and larynx: No pharyngeal mass or significant swelling identified within limitations of metallic dental streak artifact. No parapharyngeal or retropharyngeal fluid collection. Unremarkable larynx. Salivary glands: No inflammation, mass, or stone. Thyroid: Right larger than left  thyroid nodules, previously evaluated by ultrasound as well as FNA of the dominant right lower pole nodule. Lymph nodes: No suspicious or grossly enlarged lymph nodes in the neck. A 7 mm short axis right supraclavicular lymph node is asymmetrically and large compared to the contralateral side and nonspecific though most likely reactive given patient's recent illness. Vascular: Major vascular structures of the neck are patent. Mild atherosclerosis is noted of the aortic arch and carotid arteries. Limited intracranial: 3.3 x 1.6 cm homogeneously enhancing extra-axial mass in the right cerebellopontine angle cistern extending across the porus acusticus and extending superiorly to the tentorium with mild mass effect on the medulla and no significant expansion of the internal auditory canal. Visualized orbits: Unremarkable. Mastoids and visualized paranasal sinuses: Clear. Skeleton: Mild diffuse cervical facet arthrosis. Moderate C1-2 arthrosis. Upper chest: Mild mosaic attenuation in the lung apices which may reflect air trapping. Other: None. IMPRESSION: 1. No abscess or other acute abnormality identified in the neck. 2. 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 Contrast-enhanced brain MRI utilizing the IAC protocol is recommended on a nonemergent basis for further evaluation. 3.  Aortic Atherosclerosis (ICD10-I70.0). Electronically Signed   By: Logan Bores M.D.   On: 04/06/2018 15:15    Procedures Procedures (including critical care time)  Medications Ordered in ED Medications  metoprolol tartrate (LOPRESSOR) injection 5 mg (has no administration in time range)  sodium chloride 0.9 % bolus 500 mL (has no administration in time range)  iohexol (OMNIPAQUE) 300 MG/ML solution 100 mL (75 mLs Intravenous Contrast Given 04/06/18 1446)     Initial Impression / Assessment and Plan / ED Course  I have reviewed the triage vital signs and the nursing notes.  Pertinent labs & imaging results that were available during my care of the patient were reviewed by me and considered in my medical decision making (see chart for details).     Patient seen and examined. Work-up reviewed.  Rapid atrial fibrillation noted.  Patient will be given fluid bolus and dose of Lopressor.  Vital signs reviewed and are as follows: BP (!) 140/95   Pulse (!) 112   Temp 98.2 F (36.8 C) (Oral)   Resp 20   SpO2 100%   Patient informed of incidental right sided cerebropontine angle mass.  She will need this followed up as an outpatient.  We discussed possible differential diagnosis and need for MRI to further delineate.  Discussed that she can do this as an outpatient.  Patient verbalizes understanding and agrees.  She states she will follow-up with her primary care doctor tomorrow.  Will reassess.  6:34 PM after metoprolol, heart rate averaging about 110.  Patient continues to be asymptomatic.  She has received 500 cc bolus.  She is feeling well.  Patient is comfortable with discharge to the at this point.  I do not feel that she needs admission for her atrial fibrillation.  She has beta-blockers at home and current occurrence is not  unusual.  She received Rocephin yesterday and will follow up with her primary care for further treatment of her strep throat.  She will contact her PCP tomorrow regarding the other incidental findings.  7:25 PM Per RN prior to discharge, pt with HR up to 130's. Pt asymptomatic, ambulatory, feels well and wants to go home. I do not feel strongly about keeping her for further BP optimization given no symptoms. She has appropriate PCP f/u and beta blockers at home.  Final Clinical Impressions(s) / ED Diagnoses   Final diagnoses:  Sore throat  Cerebellopontine angle tumor (HCC)  Atrial fibrillation with RVR (HCC)   Sore throat -patient treated presumptively as strep throat by her PCP.  Patient had CT scan today which did not demonstrate any abscesses.  Patient did have swollen lymph node.  She is tolerating secretions and has a reassuring exam.  Will continue treatment per PCP.  Patient was given Rocephin yesterday.  Cerebellar pontine angle tumor -unclear etiology.  Patient is asymptomatic.  Refer to PCP for further imaging and specialty referral.  Atrial fibrillation with RVR -patient has chronic atrial fibrillation controlled on metoprolol.  Suspect that underlying illness is contributing.  Patient was given IV metoprolol with controlled heart rate to about 110.  She is asymptomatic.  She is on Eliquis.  No indications for admission at this point.  She will continue her home medications.  She is comfortable with discharged home with PCP recheck.  ED Discharge Orders    None       Carlisle Cater, PA-C 04/06/18 1846    Carlisle Cater, PA-C 04/06/18 Sherron Flemings, MD 04/08/18 [phone]

## 2018-04-06 NOTE — ED Notes (Signed)
No reply for vitals re-check at 14:50.

## 2018-04-06 NOTE — ED Provider Notes (Signed)
Patient placed in Quick Look pathway, seen and evaluated   Chief Complaint: Sore throat  HPI:   Patient presents to ED referred by PCP to rule out a peritonsillar abscess.  She has had a sore throat for about 1 week.  She has been on a 7-day course of clindamycin which she has been taking regularly.  She is also been taking hydrocodone with no improvement in her pain.  She was given an IM Rocephin shot in the office.  States that they cultured her throat swab was positive for strep.  She states that the pain has been getting worse especially on the left side of her throat.  Denies any trismus, drooling, fevers, neck pain.  ROS: Sore throat  Physical Exam:   Gen: No distress  Neuro: Awake and Alert  Skin: Warm    Focused Exam: No tonsillar enlargement or uvular deviation noted.  Full active and passive range of motion of neck without difficulty.  No trismus, drooling noted. Normal voice noted.   Patient was sent over for CT soft tissue neck to rule out abscess.  Will order this as well as CBC, BMP.  Initiation of care has begun. The patient has been counseled on the process, plan, and necessity for staying for the completion/evaluation, and the remainder of the medical screening examination    Delia Heady, PA-C 04/06/18 1220    Mesner, Corene Cornea, MD 04/07/18 1015

## 2018-04-06 NOTE — ED Notes (Signed)
ED Provider at bedside. 

## 2018-04-08 ENCOUNTER — Encounter (HOSPITAL_COMMUNITY): Payer: Self-pay | Admitting: Emergency Medicine

## 2018-04-08 ENCOUNTER — Inpatient Hospital Stay (HOSPITAL_COMMUNITY)
Admission: EM | Admit: 2018-04-08 | Discharge: 2018-04-15 | DRG: 872 | Disposition: A | Discharge status: Home / Self Care | Payer: Medicare Other | Attending: Internal Medicine | Admitting: Internal Medicine

## 2018-04-08 ENCOUNTER — Other Ambulatory Visit: Payer: Self-pay

## 2018-04-08 ENCOUNTER — Emergency Department (HOSPITAL_COMMUNITY): Payer: Medicare Other

## 2018-04-08 DIAGNOSIS — J029 Acute pharyngitis, unspecified: Secondary | ICD-10-CM

## 2018-04-08 DIAGNOSIS — D329 Benign neoplasm of meninges, unspecified: Secondary | ICD-10-CM | POA: Diagnosis not present

## 2018-04-08 DIAGNOSIS — I272 Pulmonary hypertension, unspecified: Secondary | ICD-10-CM | POA: Diagnosis present

## 2018-04-08 DIAGNOSIS — E039 Hypothyroidism, unspecified: Secondary | ICD-10-CM | POA: Diagnosis present

## 2018-04-08 DIAGNOSIS — Z8371 Family history of colonic polyps: Secondary | ICD-10-CM

## 2018-04-08 DIAGNOSIS — T465X5A Adverse effect of other antihypertensive drugs, initial encounter: Secondary | ICD-10-CM | POA: Diagnosis not present

## 2018-04-08 DIAGNOSIS — E8770 Fluid overload, unspecified: Secondary | ICD-10-CM

## 2018-04-08 DIAGNOSIS — Z887 Allergy status to serum and vaccine status: Secondary | ICD-10-CM

## 2018-04-08 DIAGNOSIS — Z803 Family history of malignant neoplasm of breast: Secondary | ICD-10-CM

## 2018-04-08 DIAGNOSIS — E785 Hyperlipidemia, unspecified: Secondary | ICD-10-CM | POA: Diagnosis not present

## 2018-04-08 DIAGNOSIS — D649 Anemia, unspecified: Secondary | ICD-10-CM | POA: Diagnosis present

## 2018-04-08 DIAGNOSIS — L52 Erythema nodosum: Secondary | ICD-10-CM | POA: Diagnosis not present

## 2018-04-08 DIAGNOSIS — A409 Streptococcal sepsis, unspecified: Secondary | ICD-10-CM | POA: Diagnosis not present

## 2018-04-08 DIAGNOSIS — Z8249 Family history of ischemic heart disease and other diseases of the circulatory system: Secondary | ICD-10-CM

## 2018-04-08 DIAGNOSIS — Z87891 Personal history of nicotine dependence: Secondary | ICD-10-CM

## 2018-04-08 DIAGNOSIS — E872 Acidosis: Secondary | ICD-10-CM | POA: Diagnosis present

## 2018-04-08 DIAGNOSIS — I10 Essential (primary) hypertension: Secondary | ICD-10-CM | POA: Diagnosis present

## 2018-04-08 DIAGNOSIS — I481 Persistent atrial fibrillation: Secondary | ICD-10-CM | POA: Diagnosis present

## 2018-04-08 DIAGNOSIS — R21 Rash and other nonspecific skin eruption: Secondary | ICD-10-CM | POA: Diagnosis present

## 2018-04-08 DIAGNOSIS — Z888 Allergy status to other drugs, medicaments and biological substances status: Secondary | ICD-10-CM

## 2018-04-08 DIAGNOSIS — I4891 Unspecified atrial fibrillation: Secondary | ICD-10-CM | POA: Diagnosis not present

## 2018-04-08 DIAGNOSIS — Z90710 Acquired absence of both cervix and uterus: Secondary | ICD-10-CM

## 2018-04-08 DIAGNOSIS — J020 Streptococcal pharyngitis: Secondary | ICD-10-CM

## 2018-04-08 DIAGNOSIS — Z841 Family history of disorders of kidney and ureter: Secondary | ICD-10-CM

## 2018-04-08 DIAGNOSIS — Z79890 Hormone replacement therapy (postmenopausal): Secondary | ICD-10-CM

## 2018-04-08 DIAGNOSIS — Z9049 Acquired absence of other specified parts of digestive tract: Secondary | ICD-10-CM

## 2018-04-08 DIAGNOSIS — J392 Other diseases of pharynx: Secondary | ICD-10-CM | POA: Diagnosis not present

## 2018-04-08 DIAGNOSIS — A419 Sepsis, unspecified organism: Secondary | ICD-10-CM | POA: Diagnosis present

## 2018-04-08 DIAGNOSIS — Z7901 Long term (current) use of anticoagulants: Secondary | ICD-10-CM

## 2018-04-08 HISTORY — DX: Unspecified atrial fibrillation: I48.91

## 2018-04-08 LAB — COMPREHENSIVE METABOLIC PANEL
ALBUMIN: 3.1 g/dL — AB (ref 3.5–5.0)
ALK PHOS: 155 U/L — AB (ref 38–126)
ALT: 24 U/L (ref 14–54)
ANION GAP: 12 (ref 5–15)
AST: 24 U/L (ref 15–41)
BUN: 10 mg/dL (ref 6–20)
CALCIUM: 8.9 mg/dL (ref 8.9–10.3)
CO2: 30 mmol/L (ref 22–32)
Chloride: 91 mmol/L — ABNORMAL LOW (ref 101–111)
Creatinine, Ser: 0.95 mg/dL (ref 0.44–1.00)
GFR calc Af Amer: >60 mL/min (ref >60)
GFR calc non Af Amer: 57 mL/min — ABNORMAL LOW (ref >60)
GLUCOSE: 157 mg/dL — AB (ref 65–99)
Potassium: 3.5 mmol/L (ref 3.5–5.1)
SODIUM: 133 mmol/L — AB (ref 135–145)
Total Bilirubin: 0.7 mg/dL (ref 0.3–1.2)
Total Protein: 6.8 g/dL (ref 6.5–8.1)

## 2018-04-08 LAB — URINALYSIS, ROUTINE W REFLEX MICROSCOPIC
Bilirubin Urine: NEGATIVE
Glucose, UA: NEGATIVE mg/dL
Hgb urine dipstick: NEGATIVE
Ketones, ur: NEGATIVE mg/dL
Leukocytes, UA: NEGATIVE
NITRITE: NEGATIVE
Protein, ur: NEGATIVE mg/dL
Specific Gravity, Urine: 1.011 (ref 1.005–1.030)
pH: 6 (ref 5.0–8.0)

## 2018-04-08 LAB — CBC WITH DIFFERENTIAL/PLATELET
ABS IMMATURE GRANULOCYTES: 0.1 10*3/uL (ref 0.0–0.1)
Basophils Absolute: 0 10*3/uL (ref 0.0–0.1)
Basophils Relative: 0 %
EOS ABS: 0.1 10*3/uL (ref 0.0–0.7)
Eosinophils Relative: 1 %
HEMATOCRIT: 34.8 % — AB (ref 36.0–46.0)
Hemoglobin: 10.8 g/dL — ABNORMAL LOW (ref 12.0–15.0)
IMMATURE GRANULOCYTES: 1 %
LYMPHS ABS: 0.7 10*3/uL (ref 0.7–4.0)
Lymphocytes Relative: 5 %
MCH: 26.4 pg (ref 26.0–34.0)
MCHC: 31 g/dL (ref 30.0–36.0)
MCV: 85.1 fL (ref 78.0–100.0)
MONO ABS: 0.8 10*3/uL (ref 0.1–1.0)
MONOS PCT: 6 %
NEUTROS PCT: 87 %
Neutro Abs: 12.1 10*3/uL — ABNORMAL HIGH (ref 1.7–7.7)
Platelets: 570 10*3/uL — ABNORMAL HIGH (ref 150–400)
RBC: 4.09 MIL/uL (ref 3.87–5.11)
RDW: 15.5 % (ref 11.5–15.5)
WBC: 13.8 10*3/uL — ABNORMAL HIGH (ref 4.0–10.5)

## 2018-04-08 LAB — I-STAT CG4 LACTIC ACID, ED
Lactic Acid, Venous: 2.71 mmol/L (ref 0.5–1.9)
Lactic Acid, Venous: 0.86 mmol/L (ref 0.5–1.9)

## 2018-04-08 LAB — PROTIME-INR
INR: 1.53
Prothrombin Time: 18.2 seconds — ABNORMAL HIGH (ref 11.4–15.2)

## 2018-04-08 LAB — SEDIMENTATION RATE: Sed Rate: 74 mm/hr — ABNORMAL HIGH (ref 0–22)

## 2018-04-08 LAB — C-REACTIVE PROTEIN: CRP: 12.3 mg/dL — ABNORMAL HIGH (ref <1.0)

## 2018-04-08 MED ORDER — ONDANSETRON HCL 4 MG PO TABS: 4.0000 mg | ORAL_TABLET | Freq: Four times a day (QID) | ORAL | Status: DC | PRN

## 2018-04-08 MED ORDER — LEVOTHYROXINE SODIUM 75 MCG PO TABS
75.0000 ug | ORAL_TABLET | Freq: Every day | ORAL | Status: DC
  Administered 2018-04-09 – 2018-04-15 (×7): 75 ug via ORAL
  Filled 2018-04-08 (×8): qty 1

## 2018-04-08 MED ORDER — CALCIUM CARBONATE-VITAMIN D 500-200 MG-UNIT PO TABS
1.0000 | ORAL_TABLET | Freq: Every day | ORAL | Status: DC
  Administered 2018-04-09 – 2018-04-14 (×7): 1 via ORAL
  Filled 2018-04-08 (×7): qty 1

## 2018-04-08 MED ORDER — MENTHOL 3 MG MT LOZG
1.0000 | LOZENGE | OROMUCOSAL | Status: DC | PRN
Start: 2018-04-08 — End: 2018-04-15
  Administered 2018-04-10 – 2018-04-11 (×2): 3 mg via ORAL
  Filled 2018-04-08 (×2): qty 9

## 2018-04-08 MED ORDER — ESTRADIOL 1 MG PO TABS
0.5000 mg | ORAL_TABLET | ORAL | Status: DC
  Administered 2018-04-11 – 2018-04-15 (×3): 0.5 mg via ORAL
  Filled 2018-04-08 (×3): qty 0.5

## 2018-04-08 MED ORDER — SODIUM CHLORIDE 0.9 % IV SOLN
1.0000 g | Freq: Once | INTRAVENOUS | Status: AC
  Administered 2018-04-08: 1 g via INTRAVENOUS
  Filled 2018-04-08: qty 10

## 2018-04-08 MED ORDER — SIMVASTATIN 20 MG PO TABS
20.0000 mg | ORAL_TABLET | Freq: Every day | ORAL | Status: DC
  Administered 2018-04-09 (×2): 20 mg via ORAL
  Filled 2018-04-08 (×2): qty 1

## 2018-04-08 MED ORDER — SODIUM CHLORIDE 0.9 % IV BOLUS
1000.0000 mL | Freq: Once | INTRAVENOUS | Status: AC
  Administered 2018-04-08: 1000 mL via INTRAVENOUS

## 2018-04-08 MED ORDER — METOPROLOL SUCCINATE ER 50 MG PO TB24
150.0000 mg | ORAL_TABLET | Freq: Every day | ORAL | Status: DC
  Administered 2018-04-09 – 2018-04-15 (×7): 150 mg via ORAL
  Filled 2018-04-08 (×7): qty 1

## 2018-04-08 MED ORDER — VALSARTAN-HYDROCHLOROTHIAZIDE 320-12.5 MG PO TABS: 0.5000 | ORAL_TABLET | Freq: Every day | ORAL | Status: DC

## 2018-04-08 MED ORDER — DILTIAZEM LOAD VIA INFUSION
20.0000 mg | Freq: Once | INTRAVENOUS | Status: AC
  Administered 2018-04-08: 20 mg via INTRAVENOUS
  Filled 2018-04-08: qty 20

## 2018-04-08 MED ORDER — VITAMIN D 1000 UNITS PO TABS
2000.0000 [IU] | ORAL_TABLET | Freq: Every day | ORAL | Status: DC
  Administered 2018-04-09 – 2018-04-14 (×7): 2000 [IU] via ORAL
  Filled 2018-04-08 (×7): qty 2

## 2018-04-08 MED ORDER — DILTIAZEM HCL-DEXTROSE 100-5 MG/100ML-% IV SOLN (PREMIX)
5.0000 mg/h | INTRAVENOUS | Status: DC
  Administered 2018-04-08: 7.5 mg/h via INTRAVENOUS
  Administered 2018-04-09 – 2018-04-10 (×5): 15 mg/h via INTRAVENOUS
  Filled 2018-04-08 (×7): qty 100

## 2018-04-08 MED ORDER — SODIUM CHLORIDE 0.9 % IV SOLN: 1.0000 g | Freq: Once | INTRAVENOUS | Status: DC

## 2018-04-08 MED ORDER — OXYCODONE-ACETAMINOPHEN 5-325 MG PO TABS
1.0000 | ORAL_TABLET | ORAL | Status: DC | PRN
  Administered 2018-04-09 – 2018-04-13 (×12): 1 via ORAL
  Filled 2018-04-08 (×12): qty 1

## 2018-04-08 MED ORDER — APIXABAN 5 MG PO TABS
5.0000 mg | ORAL_TABLET | Freq: Two times a day (BID) | ORAL | Status: DC
  Administered 2018-04-09 – 2018-04-15 (×14): 5 mg via ORAL
  Filled 2018-04-08 (×14): qty 1

## 2018-04-08 MED ORDER — SODIUM CHLORIDE 0.9 % IV BOLUS
500.0000 mL | Freq: Once | INTRAVENOUS | Status: AC
  Administered 2018-04-08: 500 mL via INTRAVENOUS

## 2018-04-08 MED ORDER — ZOLPIDEM TARTRATE 5 MG PO TABS
5.0000 mg | ORAL_TABLET | Freq: Every evening | ORAL | Status: DC | PRN
  Administered 2018-04-09 – 2018-04-14 (×6): 5 mg via ORAL
  Filled 2018-04-08 (×7): qty 1

## 2018-04-08 MED ORDER — VITAMIN C 500 MG PO TABS
1000.0000 mg | ORAL_TABLET | Freq: Every day | ORAL | Status: DC
  Administered 2018-04-09 – 2018-04-14 (×7): 1000 mg via ORAL
  Filled 2018-04-08 (×7): qty 2

## 2018-04-08 MED ORDER — HYDRALAZINE HCL 20 MG/ML IJ SOLN
5.0000 mg | INTRAMUSCULAR | Status: DC | PRN
  Filled 2018-04-08: qty 1

## 2018-04-08 MED ORDER — METOPROLOL SUCCINATE ER 100 MG PO TB24: 100.0000 mg | ORAL_TABLET | ORAL | Status: DC

## 2018-04-08 MED ORDER — ONDANSETRON HCL 4 MG/2ML IJ SOLN: 4.0000 mg | Freq: Four times a day (QID) | INTRAMUSCULAR | Status: DC | PRN

## 2018-04-08 MED ORDER — ACETAMINOPHEN 325 MG PO TABS
650.0000 mg | ORAL_TABLET | Freq: Four times a day (QID) | ORAL | Status: DC | PRN
Start: 2018-04-08 — End: 2018-04-15
  Administered 2018-04-11 – 2018-04-13 (×2): 650 mg via ORAL
  Filled 2018-04-08 (×2): qty 2

## 2018-04-08 MED ORDER — POLYETHYLENE GLYCOL 3350 17 G PO PACK
17.0000 g | PACK | Freq: Every day | ORAL | Status: DC | PRN
  Administered 2018-04-10: 17 g via ORAL
  Filled 2018-04-08: qty 1

## 2018-04-08 MED ORDER — FOLIC ACID 1 MG PO TABS
500.0000 ug | ORAL_TABLET | Freq: Every day | ORAL | Status: DC
  Administered 2018-04-09 – 2018-04-15 (×7): 0.5 mg via ORAL
  Filled 2018-04-08 (×7): qty 1

## 2018-04-08 NOTE — H&P (Signed)
History and Physical    Cynthia Combs FGH:[id number] DOB: 1941-04-05 DOA: 04/08/2018  Referring MD/NP/PA:   PCP: Crist Infante, MD   Patient coming from:  The patient is coming from home.  At baseline, pt is independent for most of ADL.   Chief Complaint: Sore throat and rash   HPI: Cynthia Combs is a 77 y.o. female with medical history significant of hypertension, hyperlipidemia, hypothyroidism, anemia, atrial fibrillation on Eliquis, who presents with sore throat and patient states that rash.  Pt states that because of sore throat, she was diagnosed as strep throat 2 weeks ago by PCP.  She was given 1 intramuscular Rocephin injection, then started with Cefdinir. She states that she does not have significant improvement.  She continues to have throat pain, which is sharp, scratching pain, 4 out of 10 severity, nonradiating.  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 Her doctor is concerned that patient may have sepsis, therefore sent her to emergency room for further evaluation and treatment.  Patient states that she does not have fever or chills.  She has mild dry cough, but no shortness of breath or chest pain.  She is mildly constipated, no nausea, vomiting, diarrhea, abdominal pain, symptoms of UTI.  Patient states that 2 days ago she developed nodular rash in both arms, which is pink-colored, not itchy, but mildly painful.  ED Course: pt was found to have WBC 13.8, lactic acid 2.71, electrolytes renal function okay, temperature normal, A. fib with RVR, EKG, oxygen saturation 96% on room air, chest x-ray negative.  Patient is placed on stepdown bed for observation.  Review of Systems:   General: no fevers, chills, no body weight gain, has poor appetite, has fatigue HEENT: no blurry vision, hearing changes. Has sore throat. Respiratory: no dyspnea, has  dry coughing, no wheezing CV: no chest pain, no palpitations GI: no nausea, vomiting, abdominal pain, diarrhea, constipation GU: no dysuria, burning on urination, increased urinary frequency, hematuria  Ext: has leg edema Neuro: no unilateral weakness, numbness, or tingling, no vision change or hearing loss Skin: has rash in arm, no skin tear. MSK: No muscle spasm, no deformity, no limitation of range of movement in spin Heme: No easy bruising.  Travel history: No recent long distant travel.  Allergy:  Allergies  Allergen Reactions  . Crestor [Rosuvastatin Calcium] Other (See Comments)    Muscle weakness  . Lisinopril Other (See Comments)    Unknown reaction - reported by [facility]  . Niacin And Related Other (See Comments)    Facial redness  . Other Other (See Comments)    Passed out after swine flu shot, couldn't move for several days  . Prevnar 13 [Pneumococcal 13-Val Conj Vacc] Other (See Comments)    Arm tingled for months after administration  . Singulair [Montelukast Sodium] Other (See Comments)    Blood pressure increased    Past Medical History:  Diagnosis Date  . Anemia   . Atrial fibrillation ([place])   . Cyst    HX of   . Hemorrhoids   . Hyperlipemia   . Hypertension   . Hyperthyroidism   . Thyroid nodule     Past Surgical History:  Procedure Laterality Date  . ABDOMINAL EXPLORATION SURGERY    . ABDOMINAL HYSTERECTOMY    . APPENDECTOMY    . CHOLECYSTECTOMY    . COLON SURGERY  Due to large cyst/tumor--Benign  . DILATION AND CURETTAGE OF UTERUS    . INCISE AND DRAIN ABCESS     left hand  . KNEE SURGERY     Right     Social History:  reports that she has quit smoking. She has never used smokeless tobacco. She reports that she does not drink alcohol or use drugs.  Family History:  Family History  Problem Relation Age of Onset  . Colon polyps Mother   . Heart disease Mother   . Colon polyps Sister   . Breast cancer Maternal Aunt    . Kidney disease Father   . Colon cancer Neg Hx      Prior to Admission medications   Medication Sig Start Date End Date Taking? Authorizing Provider  apixaban (ELIQUIS) 5 MG TABS tablet Take 1 tablet (5 mg total) by mouth 2 (two) times daily. 03/30/17   Sherran Needs, NP  Ascorbic Acid (VITAMIN C) 1000 MG tablet Take 1,000 mg by mouth daily.    [provider]  Calcium Carbonate-Vit D-Min (CALCIUM 1200 PO) Take 1 capsule by mouth daily.    [provider]  estradiol (ESTRACE) 0.5 MG tablet Take 0.5 mg by mouth 3 (three) times a week.    [provider]  folic acid (FOLVITE) 188 MCG tablet Take 400 mcg by mouth daily.    [provider]  furosemide (LASIX) 20 MG tablet Take 20 mg by mouth daily.    [provider]  levothyroxine (SYNTHROID, LEVOTHROID) 75 MCG tablet Take 75 mcg by mouth daily.    [provider]  metoprolol succinate (TOPROL-XL) 100 MG 24 hr tablet Take 100 mg by mouth daily. Take with or immediately following a meal.    [provider]  metoprolol succinate (TOPROL-XL) 50 MG 24 hr tablet Take 50 mg by mouth daily. Take with or immediately following a meal.    [provider]  simvastatin (ZOCOR) 20 MG tablet Take 20 mg by mouth every evening.    [provider]  valsartan-hydrochlorothiazide (DIOVAN-HCT) 160-12.5 MG tablet Take 1 tablet by mouth daily.    [provider]    Physical Exam: Vitals:   04/09/18 0053 04/09/18 0100 04/09/18 0200 04/09/18 0249  BP: (!) 149/97  113/60 133/87  Pulse: 87  (!) 109 (!) 117  Resp: (!) 22  20 (!) 23  Temp:  97.6 F (36.4 C)  97.8 F (36.6 C)  TempSrc:  Oral  Oral  SpO2: 100%  97% 100%  Weight: 97.3 kg (214 lb 8.1 oz)     Height: 6\' 1"  (1.854 m)      General: Not in acute distress HEENT:       Eyes: PERRL, EOMI, no scleral icterus.       ENT: No discharge from the ears and nose, has pharynx injection, no tonsillar enlargement.         Neck: No JVD, no bruit, no mass felt. Heme: No neck lymph node enlargement. Cardiac: S1/S2, irregularly irregular rhythm, no murmurs, No gallops or rubs. Respiratory:  No rales, wheezing, rhonchi or rubs. GI: Soft, nondistended, nontender, no rebound pain, no organomegaly, BS present. GU: No hematuria Ext: has trace lower leg edema bilaterally. 2+DP/PT pulse bilaterally. Musculoskeletal: No joint deformities, No joint redness or warmth, no limitation of ROM in spin. Skin:  Has nodular rash in both arms, pink-colored, not itchy, but mildly painful Neuro: Alert, oriented X3, cranial nerves II-XII grossly intact, moves all extremities normally.  Psych: Patient is not psychotic, no suicidal or hemocidal ideation.  Labs on Admission: I have personally reviewed following labs and imaging studies  CBC: Recent Labs  Lab 04/06/18 1224 04/08/18 1954  WBC 14.8* 13.8*  NEUTROABS  --  12.1*  HGB 11.0* 10.8*  HCT 34.9* 34.8*  MCV 83.9 85.1  PLT 526* 845*   Basic Metabolic Panel: Recent Labs  Lab 04/06/18 1224 04/08/18 1954  NA 131* 133*  K 3.4* 3.5  CL 90* 91*  CO2 31 30  GLUCOSE 114* 157*  BUN 12 10  CREATININE 0.78 0.95  CALCIUM 8.8* 8.9   GFR: Estimated Creatinine Clearance: 67 mL/min (by C-G formula based on SCr of 0.95 mg/dL). Liver Function Tests: Recent Labs  Lab 04/08/18 1954  AST 24  ALT 24  ALKPHOS 155*  BILITOT 0.7  PROT 6.8  ALBUMIN 3.1*   No results for input(s): LIPASE, AMYLASE in the last 168 hours. No results for input(s): AMMONIA in the last 168 hours. Coagulation Profile: Recent Labs  Lab 04/08/18 1954  INR 1.53   Cardiac Enzymes: Recent Labs  Lab 04/08/18 2336  TROPONINI <0.03   BNP (last 3 results) No results for input(s): PROBNP in the last 8760 hours. HbA1C: No results for input(s): HGBA1C in the last 72 hours. CBG: No results for input(s): GLUCAP in the last 168 hours. Lipid Profile: No results for input(s): CHOL, HDL, LDLCALC, TRIG,  CHOLHDL, LDLDIRECT in the last 72 hours. Thyroid Function Tests: No results for input(s): TSH, T4TOTAL, FREET4, T3FREE, THYROIDAB in the last 72 hours. Anemia Panel: No results for input(s): VITAMINB12, FOLATE, FERRITIN, TIBC, IRON, RETICCTPCT in the last 72 hours. Urine analysis:    Component Value Date/Time   COLORURINE YELLOW 04/08/2018 [place] 04/08/2018 2255   LABSPEC 1.011 04/08/2018 2255   PHURINE 6.0 04/08/2018 2255   GLUCOSEU NEGATIVE 04/08/2018 2255   HGBUR NEGATIVE 04/08/2018 2255   BILIRUBINUR NEGATIVE 04/08/2018 2255   KETONESUR NEGATIVE 04/08/2018 2255   PROTEINUR NEGATIVE 04/08/2018 2255   UROBILINOGEN 0.2 03/23/2008 2017   NITRITE NEGATIVE 04/08/2018 2255   LEUKOCYTESUR NEGATIVE 04/08/2018 2255   Sepsis Labs: @LABRCNTIP (procalcitonin:4,lacticidven:4) )No results found for this or any previous visit (from the past 240 hour(s)).   Radiological Exams on Admission: Dg Chest Portable 1 View  Result Date: 04/08/2018 CLINICAL DATA:  Strep throat for 2 weeks, tachycardia EXAM: PORTABLE CHEST 1 VIEW COMPARISON:  03/23/2008 FINDINGS: Cardiac shadow is mildly enlarged. The lungs are well aerated bilaterally. Increased density is noted superimposed over the anterior aspect of the right fourth rib consistent with rib calcification. No focal infiltrate or effusion is seen. No acute bony abnormality is noted. IMPRESSION: No active disease. Electronically Signed   By: Inez Catalina M.D.   On: 04/08/2018 20:51     EKG: Independently reviewed.  Atrial fibrillation with RVR, QTC 496, poor R wave progression   Assessment/Plan Principal Problem:   Strep pharyngitis Active Problems:   Anemia   Hyperlipemia   Hypertension   Hypothyroidism   Atrial fibrillation with RVR (HCC)   Sepsis (HCC)   Meningioma (HCC)   Rash   Sepsis due to strep pharyngitis: Patient failed outpatient antibiotic treatment.  Now developed sepsis with leukocytosis, tachycardia and  tachypnea.  Lactic acid elevated 2.71.  Currently hemodynamically stable. crrp 12.3.  -Placed on stepdown for inpatient due to A. fib with RVR -IV Rocephin -Blood culture -PRN Cepacol -will get Procalcitonin and trend lactic acid levels per sepsis protocol. -IVF: 2.5L  of NS bolus   Atrial fibrillation with RVR: This is most likely triggered by sepsis and ongoing infection. HR is upto 140s. CHA2DS2-VASc Score is 4, needs oral anticoagulation. Patient is onEliquis at home. -Cardizem drip -Continue Eliquis -Continue home metoprolol -Check TSH, troponin x3, A1c, FLP -follow-up 2D echo ( due to leg edema, patient may have CHF)  Anemia: Hemoglobin stable, 10.8 -Follow-up with CBC  HLD: -Zocor  HTN:  -Continue home medications: Metoprolol, irbesartan -hold HCTZ and lasix due to sepsis -IV hydralazine prn  Hypothyroidism: Last TSH was 1.650 on 03/30/17 -Continue home Synthroid  Possible meningioma ([place]): This is incidental finding. -May give referral to neurosurgery to follow-up  Rash: Unclear etiology, not sure but diagnosis, possible erythema nodosum? -Follow-up with dermatologist    DVT ppx: on Eliquis Code Status: Full code Family Communication: None at bed side. Disposition Plan:  Anticipate discharge back to previous home environment Consults called:  none Admission status:  SDU/obs  Date of Service 04/09/2018    Ivor Costa [facility] Pager [phone]  If 7PM-7AM, please contact night-coverage [email or web address] Password [email or web address] 04/09/2018, 2:52 AM

## 2018-04-08 NOTE — ED Provider Notes (Signed)
Patient placed in Quick Look pathway, seen and evaluated   Chief Complaint: Sore throat, rash  HPI:   Patient presents at the request of her primary care physician for infectious work-up.  She states that she has had a sore throat for the past several weeks.  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  She was also found to be in A. fib with RVR with some improvement while in the ED.  She was seen and evaluated by her primary care physician Dr. Haynes Kerns today.  She was found to have tender erythematous nodules to her bilateral upper extremities and around her face which she states have been present for 2 days.  He was concerned for rheumatic nodules and recommended presentation to the ED for possible infectious disease work-up/sepsis rule out. she denies fevers, drooling, shortness of breath, or chest pain.  She does note palpitations when her heart rate increases.   ROS: Positive for rash, sore throat, palpitations, negative for chest pain, shortness of breath, nausea, vomiting  Physical Exam:   Gen: No distress  Neuro: Awake and Alert  Skin: Warm    Focused Exam: Left anterior cervical lymphadenopathy noted.  Posterior oropharynx with mild erythema, tonsils are surgically absent.  No exudates or uvular deviation.  She is tachycardic, irregular rhythm.  2+ radial and DP/PT pulses bilaterally.  Lungs clear to auscultation.  She has multiple raised erythematous tender nodules to the bilateral upper extremities and around the hairline.  No drainage.  Initiation of care has begun. The patient has been counseled on the process, plan, and necessity for staying for the completion/evaluation, and the remainder of the medical screening examination    Debroah Baller 04/08/18 1949    Lajean Saver, MD 04/08/18 2251

## 2018-04-08 NOTE — ED Provider Notes (Addendum)
[place] EMERGENCY DEPARTMENT Provider Note   CSN: [id number] Arrival date & time: 04/08/18  1821     History   Chief Complaint Chief Complaint  Patient presents with  . Sore Throat    HPI Cynthia Combs is a 77 y.o. female.  Patient c/o persistent sore throat x 2 weeks, and now also w rapid afib (history chronic a fib), and painful nodules bilateral forearms. Saw pcp today, who requested pt be admitted for id eval, and control of afib. Patient denies fever. No cough or sob. No trouble breathing or swallowing. Sore throat is worse on left - had recent ct neg for abscess. Symptoms have been persistent, constant, not improved w recent course of abx.   The history is provided by the patient.  Sore Throat  Pertinent negatives include no chest pain, no abdominal pain, no headaches and no shortness of breath.    Past Medical History:  Diagnosis Date  . Anemia   . Cyst    HX of   . Hemorrhoids   . Hyperlipemia   . Hypertension   . Hyperthyroidism   . Thyroid nodule     There are no active problems to display for this patient.   Past Surgical History:  Procedure Laterality Date  . ABDOMINAL EXPLORATION SURGERY    . ABDOMINAL HYSTERECTOMY    . APPENDECTOMY    . CHOLECYSTECTOMY    . COLON SURGERY     Due to large cyst/tumor--Benign  . DILATION AND CURETTAGE OF UTERUS    . INCISE AND DRAIN ABCESS     left hand  . KNEE SURGERY     Right      OB History   None      Home Medications    Prior to Admission medications   Medication Sig Start Date End Date Taking? Authorizing Provider  apixaban (ELIQUIS) 5 MG TABS tablet Take 1 tablet (5 mg total) by mouth 2 (two) times daily. 03/30/17   Sherran Needs, NP  Ascorbic Acid (VITAMIN C) 1000 MG tablet Take 1,000 mg by mouth daily.    [provider]  Calcium Carbonate-Vit D-Min (CALCIUM 1200 PO) Take 1 capsule by mouth daily.    [provider]  estradiol (ESTRACE) 0.5 MG tablet  Take 0.5 mg by mouth 3 (three) times a week.    [provider]  folic acid (FOLVITE) 664 MCG tablet Take 400 mcg by mouth daily.    [provider]  furosemide (LASIX) 20 MG tablet Take 20 mg by mouth daily.    [provider]  levothyroxine (SYNTHROID, LEVOTHROID) 75 MCG tablet Take 75 mcg by mouth daily.    [provider]  metoprolol succinate (TOPROL-XL) 100 MG 24 hr tablet Take 100 mg by mouth daily. Take with or immediately following a meal.    [provider]  metoprolol succinate (TOPROL-XL) 50 MG 24 hr tablet Take 50 mg by mouth daily. Take with or immediately following a meal.    [provider]  simvastatin (ZOCOR) 20 MG tablet Take 20 mg by mouth every evening.    [provider]  valsartan-hydrochlorothiazide (DIOVAN-HCT) 160-12.5 MG tablet Take 1 tablet by mouth daily.    [provider]    Family History Family History  Problem Relation Age of Onset  . Colon polyps Mother   . Heart disease Mother   . Colon polyps Sister   . Breast cancer Maternal Aunt   . Kidney disease Father   .  Colon cancer Neg Hx     Social History Social History   Tobacco Use  . Smoking status: Former Research scientist (life sciences)  . Smokeless tobacco: Never Used  Substance Use Topics  . Alcohol use: No  . Drug use: No     Allergies   Patient has no known allergies.   Review of Systems Review of Systems  Constitutional: Negative for fever.  HENT: Positive for sore throat.   Eyes: Negative for redness.  Respiratory: Negative for shortness of breath.   Cardiovascular: Negative for chest pain.  Gastrointestinal: Negative for abdominal pain and vomiting.  Genitourinary: Negative for dysuria and flank pain.  Musculoskeletal: Negative for neck pain and neck stiffness.  Skin: Negative for rash.  Neurological: Negative for headaches.  Hematological: Does not bruise/bleed easily.  Psychiatric/Behavioral: Negative for confusion.      Physical Exam Updated Vital Signs BP (!) 143/102   Pulse (!) 137   Temp 98.5 F (36.9 C) (Oral)   Resp (!) 23   Ht 1.829 m (6')   Wt 98.9 kg (218 lb)   SpO2 98%   BMI 29.57 kg/m   Physical Exam  Constitutional: She appears well-developed and well-nourished.  HENT:  Pharynx erythematous. No asymmetric swelling or abscess noted.   Eyes: Conjunctivae are normal. No scleral icterus.  Neck: Neck supple. No tracheal deviation present.  Cardiovascular: Normal heart sounds and intact distal pulses. Exam reveals no friction rub.  Tachycardic. Irregular.   Pulmonary/Chest: Effort normal. No respiratory distress.  Abdominal: Soft. Normal appearance and bowel sounds are normal. She exhibits no distension.  Musculoskeletal: She exhibits no edema.  Bilateral ankle/foot edema.   Lymphadenopathy:    She has cervical adenopathy.  Neurological: She is alert.  Skin: Skin is warm and dry. No rash noted. She is not diaphoretic.  Subcutaneous tender nodules bilateral forearms/erythema nodosum.   Psychiatric: She has a normal mood and affect.  Nursing note and vitals reviewed.    ED Treatments / Results  Labs (all labs ordered are listed, but only abnormal results are displayed) Results for orders placed or performed during the hospital encounter of 04/08/18  Comprehensive metabolic panel  Result Value Ref Range   Sodium 133 (L) 135 - 145 mmol/L   Potassium 3.5 3.5 - 5.1 mmol/L   Chloride 91 (L) 101 - 111 mmol/L   CO2 30 22 - 32 mmol/L   Glucose, Bld 157 (H) 65 - 99 mg/dL   BUN 10 6 - 20 mg/dL   Creatinine, Ser 0.95 0.44 - 1.00 mg/dL   Calcium 8.9 8.9 - 10.3 mg/dL   Total Protein 6.8 6.5 - 8.1 g/dL   Albumin 3.1 (L) 3.5 - 5.0 g/dL   AST 24 15 - 41 U/L   ALT 24 14 - 54 U/L   Alkaline Phosphatase 155 (H) 38 - 126 U/L   Total Bilirubin 0.7 0.3 - 1.2 mg/dL   GFR calc non Af Amer 57 (L) >60 mL/min   GFR calc Af Amer >60 >60 mL/min   Anion gap 12 5 - 15  CBC with Differential   Result Value Ref Range   WBC 13.8 (H) 4.0 - 10.5 K/uL   RBC 4.09 3.87 - 5.11 MIL/uL   Hemoglobin 10.8 (L) 12.0 - 15.0 g/dL   HCT 34.8 (L) 36.0 - 46.0 %   MCV 85.1 78.0 - 100.0 fL   MCH 26.4 26.0 - 34.0 pg   MCHC 31.0 30.0 - 36.0 g/dL   RDW 15.5 11.5 - 15.5 %  Platelets 570 (H) 150 - 400 K/uL   Neutrophils Relative % 87 %   Neutro Abs 12.1 (H) 1.7 - 7.7 K/uL   Lymphocytes Relative 5 %   Lymphs Abs 0.7 0.7 - 4.0 K/uL   Monocytes Relative 6 %   Monocytes Absolute 0.8 0.1 - 1.0 K/uL   Eosinophils Relative 1 %   Eosinophils Absolute 0.1 0.0 - 0.7 K/uL   Basophils Relative 0 %   Basophils Absolute 0.0 0.0 - 0.1 K/uL   Immature Granulocytes 1 %   Abs Immature Granulocytes 0.1 0.0 - 0.1 K/uL  Protime-INR  Result Value Ref Range   Prothrombin Time 18.2 (H) 11.4 - 15.2 seconds   INR 1.53   C-reactive protein  Result Value Ref Range   CRP 12.3 (H) <1.0 mg/dL  I-Stat CG4 Lactic Acid, ED  Result Value Ref Range   Lactic Acid, Venous 2.71 (HH) 0.5 - 1.9 mmol/L   Ct Soft Tissue Neck W Contrast  Result Date: 04/06/2018 CLINICAL DATA:  Bilateral neck swelling.  Strep throat. EXAM: CT NECK WITH CONTRAST TECHNIQUE: Multidetector CT imaging of the neck was performed using the standard protocol following the bolus administration of intravenous contrast. CONTRAST:  23mL OMNIPAQUE IOHEXOL 300 MG/ML  SOLN COMPARISON:  Thyroid ultrasound 10/01/2009 FINDINGS: The study is mildly motion degraded. Pharynx and larynx: No pharyngeal mass or significant swelling identified within limitations of metallic dental streak artifact. No parapharyngeal or retropharyngeal fluid collection. Unremarkable larynx. Salivary glands: No inflammation, mass, or stone. Thyroid: Right larger than left thyroid nodules, previously evaluated by ultrasound as well as FNA of the dominant right lower pole nodule. Lymph nodes: No suspicious or grossly enlarged lymph nodes in the neck. A 7 mm short axis right supraclavicular lymph node  is asymmetrically and large compared to the contralateral side and nonspecific though most likely reactive given patient's recent illness. Vascular: Major vascular structures of the neck are patent. Mild atherosclerosis is noted of the aortic arch and carotid arteries. Limited intracranial: 3.3 x 1.6 cm homogeneously enhancing extra-axial mass in the right cerebellopontine angle cistern extending across the porus acusticus and extending superiorly to the tentorium with mild mass effect on the medulla and no significant expansion of the internal auditory canal. Visualized orbits: Unremarkable. Mastoids and visualized paranasal sinuses: Clear. Skeleton: Mild diffuse cervical facet arthrosis. Moderate C1-2 arthrosis. Upper chest: Mild mosaic attenuation in the lung apices which may reflect air trapping. Other: None. IMPRESSION: 1. No abscess or other acute abnormality identified in the neck. 2. 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 Contrast-enhanced brain MRI utilizing the IAC protocol is recommended on a nonemergent basis for further evaluation. 3.  Aortic Atherosclerosis (ICD10-I70.0). Electronically Signed   By: Logan Bores M.D.   On: 04/06/2018 15:15    EKG EKG Interpretation  Date/Time:  Friday Apr 08 2018 19:43:02 EDT Ventricular Rate:  152 PR Interval:    QRS Duration: 86 QT Interval:  312 QTC Calculation: 496 R Axis:   68 Text Interpretation:  Atrial fibrillation with rapid ventricular response with premature ventricular or aberrantly conducted complexes Nonspecific ST abnormality Confirmed by Lajean Saver [phone]) on 04/08/2018 8:10:48 PM   Radiology Dg Chest Portable 1 View  Result Date: 04/08/2018 CLINICAL DATA:  Strep throat for 2 weeks, tachycardia EXAM: PORTABLE CHEST 1 VIEW COMPARISON:  03/23/2008 FINDINGS: Cardiac shadow is mildly enlarged. The lungs are well aerated bilaterally. Increased density is noted superimposed over  the anterior aspect of the  right fourth rib consistent with rib calcification. No focal infiltrate or effusion is seen. No acute bony abnormality is noted. IMPRESSION: No active disease. Electronically Signed   By: Inez Catalina M.D.   On: 04/08/2018 20:51    Procedures Procedures (including critical care time)  Medications Ordered in ED Medications - No data to display   Initial Impression / Assessment and Plan / ED Course  I have reviewed the triage vital signs and the nursing notes.  Pertinent labs & imaging results that were available during my care of the patient were reviewed by me and considered in my medical decision making (see chart for details).  Iv ns. Labs. Continuous pulse ox and monitor. o2 [place]. Stat ecg.   Pt tachycardic. Iv Ns bolus.   cardizem iv bolus/gtt - titrate for rate control.   This patients CHA2DS2-VASc Score and unadjusted Ischemic Stroke Rate (% per year) is equal to 4.8 % stroke rate/year from a score of 4  Above score calculated as 1 point each if present [CHF, HTN, DM, Vascular=MI/PAD/Aortic Plaque, Age if 65-74, or Female] Above score calculated as 2 points each if present [Age > 75, or Stroke/TIA/TE]    Reviewed nursing notes and prior charts for additional history.   Recheck rate improved on cardizem gtt.   Labs reviewed - lactate high. Iv ns boluses.   Cultures sent. Iv abx given.   CRITICAL CARE RE: afib w rvr w/ iv cardizem gtt/titration gtt, elevated lactate r/o sepsis,  Performed by: Mirna Mires Total critical care time: 35 minutes Critical care time was exclusive of separately billable procedures and treating other patients. Critical care was necessary to treat or prevent imminent or life-threatening deterioration. Critical care was time spent personally by me on the following activities: development of treatment plan with patient and/or surrogate as well as nursing, discussions with consultants, evaluation of patient's response to  treatment, examination of patient, obtaining history from patient or surrogate, ordering and performing treatments and interventions, ordering and review of laboratory studies, ordering and review of radiographic studies, pulse oximetry and re-evaluation of patient's condition.   hospitalists consulted for admission.     Final Clinical Impressions(s) / ED Diagnoses   Final diagnoses:  None    ED Discharge Orders    None           Lajean Saver, MD 06/04/18 1418

## 2018-04-08 NOTE — ED Notes (Signed)
Nurse will draw labs. 

## 2018-04-08 NOTE — ED Triage Notes (Signed)
Reports having strep throat for two weeks.  Was sent here by family physician.  Per physician he thinks she may be septic.  Endorses having afib as well.

## 2018-04-09 ENCOUNTER — Other Ambulatory Visit: Payer: Self-pay

## 2018-04-09 ENCOUNTER — Observation Stay (HOSPITAL_BASED_OUTPATIENT_CLINIC_OR_DEPARTMENT_OTHER): Payer: Medicare Other

## 2018-04-09 DIAGNOSIS — I361 Nonrheumatic tricuspid (valve) insufficiency: Secondary | ICD-10-CM

## 2018-04-09 DIAGNOSIS — J020 Streptococcal pharyngitis: Secondary | ICD-10-CM | POA: Diagnosis not present

## 2018-04-09 DIAGNOSIS — I371 Nonrheumatic pulmonary valve insufficiency: Secondary | ICD-10-CM

## 2018-04-09 LAB — CBC
HCT: 31.1 % — ABNORMAL LOW (ref 36.0–46.0)
Hemoglobin: 9.7 g/dL — ABNORMAL LOW (ref 12.0–15.0)
MCH: 26.6 pg (ref 26.0–34.0)
MCHC: 31.2 g/dL (ref 30.0–36.0)
MCV: 85.2 fL (ref 78.0–100.0)
PLATELETS: 473 10*3/uL — AB (ref 150–400)
RBC: 3.65 MIL/uL — AB (ref 3.87–5.11)
RDW: 15.6 % — ABNORMAL HIGH (ref 11.5–15.5)
WBC: 10.5 10*3/uL (ref 4.0–10.5)

## 2018-04-09 LAB — PROCALCITONIN

## 2018-04-09 LAB — LIPID PANEL
CHOL/HDL RATIO: 2.8 ratio
CHOLESTEROL: 94 mg/dL (ref 0–200)
HDL: 34 mg/dL — AB (ref >40)
LDL Cholesterol: 46 mg/dL (ref 0–99)
TRIGLYCERIDES: 69 mg/dL (ref <150)
VLDL: 14 mg/dL (ref 0–40)

## 2018-04-09 LAB — BASIC METABOLIC PANEL
Anion gap: 11 (ref 5–15)
BUN: 9 mg/dL (ref 6–20)
CALCIUM: 8.2 mg/dL — AB (ref 8.9–10.3)
CHLORIDE: 95 mmol/L — AB (ref 101–111)
CO2: 27 mmol/L (ref 22–32)
CREATININE: 0.78 mg/dL (ref 0.44–1.00)
GFR calc Af Amer: >60 mL/min (ref >60)
GFR calc non Af Amer: >60 mL/min (ref >60)
GLUCOSE: 119 mg/dL — AB (ref 65–99)
Potassium: 3 mmol/L — ABNORMAL LOW (ref 3.5–5.1)
Sodium: 133 mmol/L — ABNORMAL LOW (ref 135–145)

## 2018-04-09 LAB — ECHOCARDIOGRAM COMPLETE
Height: 73 in
Weight: 3432.12 oz

## 2018-04-09 LAB — MRSA PCR SCREENING: MRSA BY PCR: NEGATIVE

## 2018-04-09 LAB — TSH: TSH: 1.413 u[IU]/mL (ref 0.350–4.500)

## 2018-04-09 LAB — TROPONIN I
Troponin I: <0.03 ng/mL (ref <0.03)
Troponin I: <0.03 ng/mL (ref <0.03)
Troponin I: <0.03 ng/mL (ref <0.03)

## 2018-04-09 LAB — HEMOGLOBIN A1C
Hgb A1c MFr Bld: 5.6 % (ref 4.8–5.6)
Mean Plasma Glucose: 114.02 mg/dL

## 2018-04-09 LAB — MAGNESIUM: Magnesium: 1.4 mg/dL — ABNORMAL LOW (ref 1.7–2.4)

## 2018-04-09 LAB — BRAIN NATRIURETIC PEPTIDE: B Natriuretic Peptide: 154.7 pg/mL — ABNORMAL HIGH (ref 0.0–100.0)

## 2018-04-09 LAB — LACTIC ACID, PLASMA
Lactic Acid, Venous: 1.3 mmol/L (ref 0.5–1.9)
Lactic Acid, Venous: 1.4 mmol/L (ref 0.5–1.9)

## 2018-04-09 MED ORDER — POTASSIUM CHLORIDE CRYS ER 20 MEQ PO TBCR
40.0000 meq | EXTENDED_RELEASE_TABLET | Freq: Once | ORAL | Status: AC
  Administered 2018-04-09: 40 meq via ORAL
  Filled 2018-04-09: qty 2

## 2018-04-09 MED ORDER — HYDROCHLOROTHIAZIDE 10 MG/ML ORAL SUSPENSION: 6.2500 mg | Freq: Every day | ORAL | Status: DC

## 2018-04-09 MED ORDER — IRBESARTAN 150 MG PO TABS
150.0000 mg | ORAL_TABLET | Freq: Every day | ORAL | Status: DC
  Administered 2018-04-09 – 2018-04-12 (×4): 150 mg via ORAL
  Filled 2018-04-09 (×5): qty 1

## 2018-04-09 MED ORDER — SODIUM CHLORIDE 0.9 % IV SOLN
1.0000 g | Freq: Two times a day (BID) | INTRAVENOUS | Status: DC
  Administered 2018-04-09 – 2018-04-12 (×7): 1 g via INTRAVENOUS
  Filled 2018-04-09 (×7): qty 10

## 2018-04-09 NOTE — Progress Notes (Signed)
  Echocardiogram 2D Echocardiogram has been performed.  Glennette Galster G Jayli Fogleman 04/09/2018, 10:31 AM

## 2018-04-09 NOTE — Progress Notes (Signed)
Patient Demographics:    Cynthia Combs, is a 77 y.o. female, DOB - [birth year], ZOX:[id number]  Admit date - 04/08/2018   Admitting Physician Ivor Costa, MD  Outpatient Primary MD for the patient is Crist Infante, MD  LOS - 0   Chief Complaint  Patient presents with  . Sore Throat        Subjective:    Cynthia Combs today has no fevers, no emesis,  No chest pain, son and grandson at bedside, questions answered,  Assessment  & Plan :    Principal Problem:   Strep pharyngitis Active Problems:   Anemia   Hyperlipemia   Hypertension   Hypothyroidism   Atrial fibrillation with RVR ([place])   Sepsis ([place])   Meningioma ([place])   Rash  Brief summary 77 y.o. 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 apixaban  5 mg Oral BID  . calcium-vitamin D  1 tablet Oral QHS  . cholecalciferol  2,000 Units Oral QHS  . [START ON 04/11/2018] estradiol  0.5 mg Oral Q M,W,F  . folic acid  811 mcg Oral Daily  . irbesartan  150 mg Oral Daily  . levothyroxine  75 mcg Oral QAC breakfast  . metoprolol succinate  150 mg Oral Daily  . simvastatin  20 mg Oral QHS  . vitamin C  1,000 mg Oral QHS   Continuous Infusions: . cefTRIAXone (ROCEPHIN)  IV Stopped (04/09/18 1445)  . diltiazem (CARDIZEM) infusion 10 mg/hr (04/09/18 1733)   PRN Meds:.acetaminophen, hydrALAZINE, menthol-cetylpyridinium, ondansetron **OR** ondansetron (ZOFRAN) IV, oxyCODONE-acetaminophen, polyethylene glycol, zolpidem    Anti-infectives (From admission, onward)   Start     Dose/Rate Route Frequency Ordered Stop   04/09/18 2200  cefTRIAXone (ROCEPHIN) 1 g in sodium chloride 0.9 % 100 mL IVPB  Status:  Discontinued     1 g 200 mL/hr over 30 Minutes Intravenous  Once 04/08/18 2239 04/09/18 1323   04/09/18 1400  cefTRIAXone (ROCEPHIN) 1 g in sodium chloride 0.9 % 100 mL IVPB  1 g 200 mL/hr over 30 Minutes Intravenous Every 12 hours 04/09/18 1323     04/08/18 2130  cefTRIAXone (ROCEPHIN) 1 g in sodium chloride 0.9 % 100 mL IVPB     1 g 200 mL/hr over 30 Minutes Intravenous  Once 04/08/18 2124 04/08/18 2322        Objective:   Vitals:   04/09/18 1200 04/09/18 1300 04/09/18 1400 04/09/18 1530  BP: 131/82 132/76 130/69 123/71  Pulse: (!) 36 100 86 75  Resp: 17 20 14 15   Temp:    98.2 F (36.8 C)  TempSrc:    Oral  SpO2: (!) 89% 100% 98% 97%  Weight:      Height:        Wt Readings from Last 3 Encounters:  04/09/18 97.3 kg (214 lb 8.1 oz)  05/24/17 98.9 kg (218 lb)    04/19/17 97.7 kg (215 lb 6.4 oz)     Intake/Output Summary (Last 24 hours) at 04/09/2018 1923 Last data filed at 04/09/2018 1841 Gross per 24 hour  Intake 5026.5 ml  Output 0 ml  Net 5026.5 ml     Physical Exam  Gen:- Awake Alert,  Obese, in no apparent distress  HEENT:- [place].AT, No sclera icterus Neck-Supple Neck,No JVD,.  Lungs-  CTAB , no wheezing CV- S1, S2 normal, irregularly irregular heart rate around 120 Abd-  +ve B.Sounds, Abd Soft, No tenderness,    Extremity/Skin:- No  Edema, +ve   Pulses, extends outside of upper extremities with nodular erythematous lesions, no fluctuance, no streaking Psych-affect is appropriate, oriented x3 Neuro-no new focal deficits, no tremors   Data Review:   Micro Results Recent Results (from the past 240 hour(s))  Culture, blood (Routine x 2)     Status: None (Preliminary result)   Collection Time: 04/08/18  7:30 PM  Result Value Ref Range Status   Specimen Description BLOOD RIGHT ANTECUBITAL  Final   Special Requests   Final    BOTTLES DRAWN AEROBIC AND ANAEROBIC Blood Culture results may not be optimal due to an excessive volume of blood received in culture bottles   Culture   Final    NO GROWTH < 24 HOURS Performed at [facility], [place] [address], [place], [place] [address]    Report Status PENDING  Incomplete  Culture, blood (Routine x 2)     Status: None (Preliminary result)   Collection Time: 04/08/18  7:55 PM  Result Value Ref Range Status   Specimen Description BLOOD LEFT ANTECUBITAL  Final   Special Requests   Final    AEROBIC BOTTLE ONLY Blood Culture results may not be optimal due to an excessive volume of blood received in culture bottles   Culture   Final    NO GROWTH < 24 HOURS Performed at [facility], [place] [address] [place], [place] [address]    Report Status PENDING  Incomplete  MRSA PCR Screening     Status: None   Collection Time: 04/09/18  1:02 AM  Result Value Ref Range Status   MRSA by PCR  NEGATIVE NEGATIVE Final    Comment:        The GeneXpert MRSA Assay (FDA approved for NASAL specimens only), is one component of a comprehensive MRSA colonization surveillance program. It is not intended to diagnose MRSA infection nor to guide or monitor treatment for MRSA infections. Performed at [facility], [place] [address] [place], [place] [address]     Radiology Reports Ct  Soft Tissue Neck W Contrast  Result Date: 04/06/2018 CLINICAL DATA:  Bilateral neck swelling.  Strep throat. EXAM: CT NECK WITH CONTRAST TECHNIQUE: Multidetector CT imaging of the neck was performed using the standard protocol following the bolus administration of intravenous contrast. CONTRAST:  52mL OMNIPAQUE IOHEXOL 300 MG/ML  SOLN COMPARISON:  Thyroid ultrasound 10/01/2009 FINDINGS: The study is mildly motion degraded. Pharynx and larynx: No pharyngeal mass or significant swelling identified within limitations of metallic dental streak artifact. No parapharyngeal or retropharyngeal fluid collection. Unremarkable larynx. Salivary glands: No inflammation, mass, or stone. Thyroid: Right larger than left thyroid nodules, previously evaluated by ultrasound as well as FNA of the dominant right lower pole nodule. Lymph nodes: No suspicious or grossly enlarged lymph nodes in the neck. A 7 mm short axis right supraclavicular lymph node is asymmetrically and large compared to the contralateral side and nonspecific though most likely reactive given patient's recent illness. Vascular: Major vascular structures of the neck are patent. Mild atherosclerosis is noted of the aortic arch and carotid arteries. Limited intracranial: 3.3 x 1.6 cm homogeneously enhancing extra-axial mass in the right cerebellopontine angle cistern extending across the porus acusticus and extending superiorly to the tentorium with mild mass effect on the medulla and no significant expansion of the internal auditory canal. Visualized orbits: Unremarkable.  Mastoids and visualized paranasal sinuses: Clear. Skeleton: Mild diffuse cervical facet arthrosis. Moderate C1-2 arthrosis. Upper chest: Mild mosaic attenuation in the lung apices which may reflect air trapping. Other: None. IMPRESSION: 1. No abscess or other acute abnormality identified in the neck. 2. 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 Contrast-enhanced brain MRI utilizing the IAC protocol is recommended on a nonemergent basis for further evaluation. 3.  Aortic Atherosclerosis (ICD10-I70.0). Electronically Signed   By: Logan Bores M.D.   On: 04/06/2018 15:15   Dg Chest Portable 1 View  Result Date: 04/08/2018 CLINICAL DATA:  Strep throat for 2 weeks, tachycardia EXAM: PORTABLE CHEST 1 VIEW COMPARISON:  03/23/2008 FINDINGS: Cardiac shadow is mildly enlarged. The lungs are well aerated bilaterally. Increased density is noted superimposed over the anterior aspect of the right fourth rib consistent with rib calcification. No focal infiltrate or effusion is seen. No acute bony abnormality is noted. IMPRESSION: No active disease. Electronically Signed   By: Inez Catalina M.D.   On: 04/08/2018 20:51     CBC Recent Labs  Lab 04/06/18 1224 04/08/18 1954 04/09/18 0303  WBC 14.8* 13.8* 10.5  HGB 11.0* 10.8* 9.7*  HCT 34.9* 34.8* 31.1*  PLT 526* 570* 473*  MCV 83.9 85.1 85.2  MCH 26.4 26.4 26.6  MCHC 31.5 31.0 31.2  RDW 15.5 15.5 15.6*  LYMPHSABS  --  0.7  --   MONOABS  --  0.8  --   EOSABS  --  0.1  --   BASOSABS  --  0.0  --     Chemistries  Recent Labs  Lab 04/06/18 1224 04/08/18 1954 04/09/18 0303 04/09/18 1014  NA 131* 133* 133*  --   K 3.4* 3.5 3.0*  --   CL 90* 91* 95*  --   CO2 31 30 27   --   GLUCOSE 114* 157* 119*  --   BUN 12 10 9   --   CREATININE 0.78 0.95 0.78  --   CALCIUM 8.8* 8.9 8.2*  --   MG  --   --   --  1.4*  AST  --  24  --   --  ALT  --  24  --   --   ALKPHOS  --  155*  --   --   BILITOT  --   0.7  --   --    ------------------------------------------------------------------------------------------------------------------ Recent Labs    04/09/18 0303  CHOL 94  HDL 34*  LDLCALC 46  TRIG 69  CHOLHDL 2.8    Lab Results  Component Value Date   HGBA1C 5.6 04/09/2018   ------------------------------------------------------------------------------------------------------------------ Recent Labs    04/09/18 0303  TSH 1.413   ------------------------------------------------------------------------------------------------------------------ No results for input(s): VITAMINB12, FOLATE, FERRITIN, TIBC, IRON, RETICCTPCT in the last 72 hours.  Coagulation profile Recent Labs  Lab 04/08/18 1954  INR 1.53    No results for input(s): DDIMER in the last 72 hours.  Cardiac Enzymes Recent Labs  Lab 04/08/18 2336 04/09/18 0303 04/09/18 1014  TROPONINI <0.03 <0.03 <0.03   ------------------------------------------------------------------------------------------------------------------    Component Value Date/Time   BNP 154.7 (H) 04/08/2018 2336     Roxan Hockey M.D on 04/09/2018 at 7:23 PM  Between 7am to 7pm - Pager - [phone]  After 7pm go to [email or web address] - password [email or web address]  [facility] -  Office  [phone]   Voice Recognition Viviann Spare dictation system was used to create this note, attempts have been made to correct errors. Please contact the author with questions and/or clarifications.

## 2018-04-10 DIAGNOSIS — Z8371 Family history of colonic polyps: Secondary | ICD-10-CM | POA: Diagnosis not present

## 2018-04-10 DIAGNOSIS — Z7901 Long term (current) use of anticoagulants: Secondary | ICD-10-CM | POA: Diagnosis not present

## 2018-04-10 DIAGNOSIS — J029 Acute pharyngitis, unspecified: Secondary | ICD-10-CM | POA: Diagnosis not present

## 2018-04-10 DIAGNOSIS — R21 Rash and other nonspecific skin eruption: Secondary | ICD-10-CM | POA: Diagnosis not present

## 2018-04-10 DIAGNOSIS — E785 Hyperlipidemia, unspecified: Secondary | ICD-10-CM | POA: Diagnosis present

## 2018-04-10 DIAGNOSIS — E8770 Fluid overload, unspecified: Secondary | ICD-10-CM | POA: Diagnosis not present

## 2018-04-10 DIAGNOSIS — I10 Essential (primary) hypertension: Secondary | ICD-10-CM | POA: Diagnosis present

## 2018-04-10 DIAGNOSIS — D649 Anemia, unspecified: Secondary | ICD-10-CM | POA: Diagnosis present

## 2018-04-10 DIAGNOSIS — J020 Streptococcal pharyngitis: Secondary | ICD-10-CM | POA: Diagnosis present

## 2018-04-10 DIAGNOSIS — E039 Hypothyroidism, unspecified: Secondary | ICD-10-CM | POA: Diagnosis present

## 2018-04-10 DIAGNOSIS — I481 Persistent atrial fibrillation: Secondary | ICD-10-CM | POA: Diagnosis present

## 2018-04-10 DIAGNOSIS — A409 Streptococcal sepsis, unspecified: Secondary | ICD-10-CM | POA: Diagnosis present

## 2018-04-10 DIAGNOSIS — Z87891 Personal history of nicotine dependence: Secondary | ICD-10-CM | POA: Diagnosis not present

## 2018-04-10 DIAGNOSIS — D329 Benign neoplasm of meninges, unspecified: Secondary | ICD-10-CM | POA: Diagnosis present

## 2018-04-10 DIAGNOSIS — L52 Erythema nodosum: Secondary | ICD-10-CM | POA: Diagnosis present

## 2018-04-10 DIAGNOSIS — Z8249 Family history of ischemic heart disease and other diseases of the circulatory system: Secondary | ICD-10-CM | POA: Diagnosis not present

## 2018-04-10 DIAGNOSIS — Z841 Family history of disorders of kidney and ureter: Secondary | ICD-10-CM | POA: Diagnosis not present

## 2018-04-10 DIAGNOSIS — I4891 Unspecified atrial fibrillation: Secondary | ICD-10-CM | POA: Diagnosis not present

## 2018-04-10 DIAGNOSIS — Z888 Allergy status to other drugs, medicaments and biological substances status: Secondary | ICD-10-CM | POA: Diagnosis not present

## 2018-04-10 DIAGNOSIS — Z79890 Hormone replacement therapy (postmenopausal): Secondary | ICD-10-CM | POA: Diagnosis not present

## 2018-04-10 DIAGNOSIS — E872 Acidosis: Secondary | ICD-10-CM | POA: Diagnosis present

## 2018-04-10 DIAGNOSIS — J392 Other diseases of pharynx: Secondary | ICD-10-CM | POA: Diagnosis not present

## 2018-04-10 DIAGNOSIS — T465X5A Adverse effect of other antihypertensive drugs, initial encounter: Secondary | ICD-10-CM | POA: Diagnosis not present

## 2018-04-10 DIAGNOSIS — I272 Pulmonary hypertension, unspecified: Secondary | ICD-10-CM | POA: Diagnosis present

## 2018-04-10 DIAGNOSIS — I5043 Acute on chronic combined systolic (congestive) and diastolic (congestive) heart failure: Secondary | ICD-10-CM | POA: Diagnosis not present

## 2018-04-10 DIAGNOSIS — Z90710 Acquired absence of both cervix and uterus: Secondary | ICD-10-CM | POA: Diagnosis not present

## 2018-04-10 DIAGNOSIS — Z803 Family history of malignant neoplasm of breast: Secondary | ICD-10-CM | POA: Diagnosis not present

## 2018-04-10 DIAGNOSIS — Z9049 Acquired absence of other specified parts of digestive tract: Secondary | ICD-10-CM | POA: Diagnosis not present

## 2018-04-10 LAB — BASIC METABOLIC PANEL
ANION GAP: 8 (ref 5–15)
BUN: 11 mg/dL (ref 6–20)
CALCIUM: 8.7 mg/dL — AB (ref 8.9–10.3)
CO2: 26 mmol/L (ref 22–32)
CREATININE: 0.69 mg/dL (ref 0.44–1.00)
Chloride: 95 mmol/L — ABNORMAL LOW (ref 101–111)
GFR calc non Af Amer: >60 mL/min (ref >60)
Glucose, Bld: 117 mg/dL — ABNORMAL HIGH (ref 65–99)
Potassium: 4.3 mmol/L (ref 3.5–5.1)
SODIUM: 129 mmol/L — AB (ref 135–145)

## 2018-04-10 LAB — CBC
HCT: 32.2 % — ABNORMAL LOW (ref 36.0–46.0)
HEMOGLOBIN: 9.9 g/dL — AB (ref 12.0–15.0)
MCH: 26.3 pg (ref 26.0–34.0)
MCHC: 30.7 g/dL (ref 30.0–36.0)
MCV: 85.4 fL (ref 78.0–100.0)
Platelets: 458 10*3/uL — ABNORMAL HIGH (ref 150–400)
RBC: 3.77 MIL/uL — AB (ref 3.87–5.11)
RDW: 15.8 % — ABNORMAL HIGH (ref 11.5–15.5)
WBC: 14 10*3/uL — AB (ref 4.0–10.5)

## 2018-04-10 MED ORDER — MENTHOL 3 MG MT LOZG
1.0000 | LOZENGE | Freq: Once | OROMUCOSAL | Status: AC
  Administered 2018-04-10: 3 mg via ORAL
  Filled 2018-04-10: qty 9

## 2018-04-10 MED ORDER — MAGNESIUM SULFATE 2 GM/50ML IV SOLN
2.0000 g | Freq: Once | INTRAVENOUS | Status: AC
  Administered 2018-04-10: 2 g via INTRAVENOUS
  Filled 2018-04-10: qty 50

## 2018-04-10 MED ORDER — SIMVASTATIN 20 MG PO TABS
10.0000 mg | ORAL_TABLET | Freq: Every day | ORAL | Status: DC
  Administered 2018-04-10 – 2018-04-14 (×5): 10 mg via ORAL
  Filled 2018-04-10 (×5): qty 1

## 2018-04-10 MED ORDER — DILTIAZEM HCL 60 MG PO TABS
90.0000 mg | ORAL_TABLET | Freq: Four times a day (QID) | ORAL | Status: DC
  Administered 2018-04-10 – 2018-04-11 (×4): 90 mg via ORAL
  Filled 2018-04-10 (×3): qty 2
  Filled 2018-04-10: qty 1.5

## 2018-04-10 NOTE — Progress Notes (Signed)
Patient Demographics:    Cynthia Combs, is a 77 y.o. female, DOB - [birth year], IPJ:[id number]  Admit date - 04/08/2018   Admitting Physician Ivor Costa, MD  Outpatient Primary MD for the patient is Crist Infante, MD  LOS - 0   Chief Complaint  Patient presents with  . Sore Throat        Subjective:    [name] today has no emesis,  No chest pain, c/o sore throat, no fevers or chills, ambulated to the bathroom without dizziness or palpitation  Assessment  & Plan :    Principal Problem:   Strep pharyngitis Active Problems:   Anemia   Hyperlipemia   Hypertension   Hypothyroidism   Atrial fibrillation with RVR (HCC)   Sepsis (HCC)   Meningioma (HCC)   Rash  Brief summary 77 y.o. female with medical history significant of hypertension, hyperlipidemia, hypothyroidism, anemia, atrial fibrillation on Eliquis diagnosed with strep throat on 04/01/2018, failed outpatient Omnicef, admitted on 03/29/2018 with tachycardia, leukocytosis lactic acidosis recent concerns about sepsis   Plan:- 1)Sepsis secondary to streptococcal pharyngitis-fevers, no chills, leukocytosis noted, continue IV Rocephin 1 g every 12 hours as ordered ,  CT neck from 04/06/2018 without abscess, continue to hydrate.  If no improvement consider switching to IV Unasyn or Augmentin as patient failed Omnicef prior to admission.  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 Cardizem 90 mg every 6 hours and taper off the IV Cardizem,   echo shows preserved EF over 55%  4)Anemia- ?? 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 apixaban  5 mg Oral BID  . calcium-vitamin D  1 tablet Oral QHS  . cholecalciferol  2,000 Units Oral QHS  . diltiazem  90 mg Oral Q6H  . [START ON 04/11/2018] estradiol  0.5 mg Oral Q M,W,F  . folic acid  734 mcg Oral Daily  . irbesartan  150 mg Oral Daily  . levothyroxine  75 mcg Oral QAC breakfast  . menthol-cetylpyridinium  1 lozenge Oral Once  . metoprolol succinate  150 mg Oral Daily  . simvastatin  10 mg Oral QHS  . vitamin C  1,000 mg Oral QHS   Continuous Infusions: . cefTRIAXone (ROCEPHIN)  IV Stopped (04/10/18 1102)  . diltiazem (CARDIZEM) infusion Stopped (04/10/18 1525)   PRN Meds:.acetaminophen, hydrALAZINE, menthol-cetylpyridinium, ondansetron **OR** ondansetron (ZOFRAN) IV, oxyCODONE-acetaminophen, polyethylene glycol, zolpidem    Anti-infectives (From admission, onward)   Start     Dose/Rate Route Frequency Ordered Stop  04/09/18 2200  cefTRIAXone (ROCEPHIN) 1 g in sodium chloride 0.9 % 100 mL IVPB  Status:  Discontinued     1 g 200 mL/hr over 30 Minutes Intravenous  Once 04/08/18 2239 04/09/18 1323   04/09/18 1400  cefTRIAXone (ROCEPHIN) 1 g in sodium chloride 0.9 % 100 mL IVPB     1 g 200 mL/hr over 30 Minutes Intravenous Every 12 hours 04/09/18 1323     04/08/18 2130  cefTRIAXone (ROCEPHIN) 1 g in sodium chloride 0.9 % 100 mL IVPB     1 g 200 mL/hr over 30 Minutes Intravenous  Once 04/08/18 2124 04/08/18 2322        Objective:   Vitals:   04/10/18 1100 04/10/18 1300 04/10/18 1400  04/10/18 1500  BP: 138/80 130/90 129/71 122/65  Pulse: 86 (!) 103 73 65  Resp: (!) 25 19 15 16   Temp: 98 F (36.7 C)  98 F (36.7 C)   TempSrc: Oral  Oral   SpO2: 99% 97% 96% 93%  Weight:      Height:        Wt Readings from Last 3 Encounters:  04/09/18 97.3 kg (214 lb 8.1 oz)  05/24/17 98.9 kg (218 lb)  04/19/17 97.7 kg (215 lb 6.4 oz)     Intake/Output Summary (Last 24 hours) at 04/10/2018 1727 Last data filed at 04/10/2018 1454 Gross per 24 hour  Intake 752.5 ml  Output 275 ml  Net 477.5 ml    Physical Exam  Gen:- Awake Alert,   in no apparent distress  HEENT:- [place].AT, No sclera icterus Throat-no significant erythema or exudates at this time Neck-Supple Neck,No JVD,.  Lungs-  CTAB , no wheezing CV- S1, S2 normal, irregularly irregular heart rate around 96 Abd-  +ve B.Sounds, Abd Soft, No tenderness,  Extremity/Skin:- No  Edema, +ve   Pulses,  extensor side of upper extremities with nodular erythematous lesions, no fluctuance, no streaking Psych-affect is appropriate, oriented x3 Neuro-no new focal deficits, no tremors   Data Review:   Micro Results Recent Results (from the past 240 hour(s))  Culture, blood (Routine x 2)     Status: None (Preliminary result)   Collection Time: 04/08/18  7:30 PM  Result Value Ref Range Status   Specimen Description BLOOD RIGHT ANTECUBITAL  Final   Special Requests   Final    BOTTLES DRAWN AEROBIC AND ANAEROBIC Blood Culture results may not be optimal due to an excessive volume of blood received in culture bottles   Culture   Final    NO GROWTH 2 DAYS Performed at [facility], [place] [address], [place], [place] [address]    Report Status PENDING  Incomplete  Culture, blood (Routine x 2)     Status: None (Preliminary result)   Collection Time: 04/08/18  7:55 PM  Result Value Ref Range Status   Specimen Description BLOOD LEFT ANTECUBITAL  Final   Special Requests   Final    AEROBIC BOTTLE ONLY Blood Culture results may not  be optimal due to an excessive volume of blood received in culture bottles   Culture   Final    NO GROWTH 2 DAYS Performed at [facility], [place] [address] [place], [place] [address]    Report Status PENDING  Incomplete  MRSA PCR Screening     Status: None   Collection Time: 04/09/18  1:02 AM  Result Value Ref Range Status   MRSA by PCR NEGATIVE NEGATIVE Final    Comment:  The GeneXpert MRSA Assay (FDA approved for NASAL specimens only), is one component of a comprehensive MRSA colonization surveillance program. It is not intended to diagnose MRSA infection nor to guide or monitor treatment for MRSA infections. Performed at [facility], [place] [address] [place], [place] [address]     Radiology Reports Ct Soft Tissue Neck W Contrast  Result Date: 04/06/2018 CLINICAL DATA:  Bilateral neck swelling.  Strep throat. EXAM: CT NECK WITH CONTRAST TECHNIQUE: Multidetector CT imaging of the neck was performed using the standard protocol following the bolus administration of intravenous contrast. CONTRAST:  81mL OMNIPAQUE IOHEXOL 300 MG/ML  SOLN COMPARISON:  Thyroid ultrasound 10/01/2009 FINDINGS: The study is mildly motion degraded. Pharynx and larynx: No pharyngeal mass or significant swelling identified within limitations of metallic dental streak artifact. No parapharyngeal or retropharyngeal fluid collection. Unremarkable larynx. Salivary glands: No inflammation, mass, or stone. Thyroid: Right larger than left thyroid nodules, previously evaluated by ultrasound as well as FNA of the dominant right lower pole nodule. Lymph nodes: No suspicious or grossly enlarged lymph nodes in the neck. A 7 mm short axis right supraclavicular lymph node is asymmetrically and large compared to the contralateral side and nonspecific though most likely reactive given patient's recent illness. Vascular: Major vascular structures of the neck are patent. Mild atherosclerosis is noted of the aortic  arch and carotid arteries. Limited intracranial: 3.3 x 1.6 cm homogeneously enhancing extra-axial mass in the right cerebellopontine angle cistern extending across the porus acusticus and extending superiorly to the tentorium with mild mass effect on the medulla and no significant expansion of the internal auditory canal. Visualized orbits: Unremarkable. Mastoids and visualized paranasal sinuses: Clear. Skeleton: Mild diffuse cervical facet arthrosis. Moderate C1-2 arthrosis. Upper chest: Mild mosaic attenuation in the lung apices which may reflect air trapping. Other: None. IMPRESSION: 1. No abscess or other acute abnormality identified in the neck. 2. 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 Contrast-enhanced brain MRI utilizing the IAC protocol is recommended on a nonemergent basis for further evaluation. 3.  Aortic Atherosclerosis (ICD10-I70.0). Electronically Signed   By: Logan Bores M.D.   On: 04/06/2018 15:15   Dg Chest Portable 1 View  Result Date: 04/08/2018 CLINICAL DATA:  Strep throat for 2 weeks, tachycardia EXAM: PORTABLE CHEST 1 VIEW COMPARISON:  03/23/2008 FINDINGS: Cardiac shadow is mildly enlarged. The lungs are well aerated bilaterally. Increased density is noted superimposed over the anterior aspect of the right fourth rib consistent with rib calcification. No focal infiltrate or effusion is seen. No acute bony abnormality is noted. IMPRESSION: No active disease. Electronically Signed   By: Inez Catalina M.D.   On: 04/08/2018 20:51     CBC Recent Labs  Lab 04/06/18 1224 04/08/18 1954 04/09/18 0303 04/10/18 0742  WBC 14.8* 13.8* 10.5 14.0*  HGB 11.0* 10.8* 9.7* 9.9*  HCT 34.9* 34.8* 31.1* 32.2*  PLT 526* 570* 473* 458*  MCV 83.9 85.1 85.2 85.4  MCH 26.4 26.4 26.6 26.3  MCHC 31.5 31.0 31.2 30.7  RDW 15.5 15.5 15.6* 15.8*  LYMPHSABS  --  0.7  --   --   MONOABS  --  0.8  --   --   EOSABS  --  0.1  --   --   BASOSABS   --  0.0  --   --     Chemistries  Recent Labs  Lab 04/06/18 1224 04/08/18 1954 04/09/18 0303 04/09/18 1014 04/10/18 0742  NA 131*  133* 133*  --  129*  K 3.4* 3.5 3.0*  --  4.3  CL 90* 91* 95*  --  95*  CO2 31 30 27   --  26  GLUCOSE 114* 157* 119*  --  117*  BUN 12 10 9   --  11  CREATININE 0.78 0.95 0.78  --  0.69  CALCIUM 8.8* 8.9 8.2*  --  8.7*  MG  --   --   --  1.4*  --   AST  --  24  --   --   --   ALT  --  24  --   --   --   ALKPHOS  --  155*  --   --   --   BILITOT  --  0.7  --   --   --    ------------------------------------------------------------------------------------------------------------------ Recent Labs    04/09/18 0303  CHOL 94  HDL 34*  LDLCALC 46  TRIG 69  CHOLHDL 2.8    Lab Results  Component Value Date   HGBA1C 5.6 04/09/2018   ------------------------------------------------------------------------------------------------------------------ Recent Labs    04/09/18 0303  TSH 1.413   ------------------------------------------------------------------------------------------------------------------ No results for input(s): VITAMINB12, FOLATE, FERRITIN, TIBC, IRON, RETICCTPCT in the last 72 hours.  Coagulation profile Recent Labs  Lab 04/08/18 1954  INR 1.53    No results for input(s): DDIMER in the last 72 hours.  Cardiac Enzymes Recent Labs  Lab 04/08/18 2336 04/09/18 0303 04/09/18 1014  TROPONINI <0.03 <0.03 <0.03   ------------------------------------------------------------------------------------------------------------------    Component Value Date/Time   BNP 154.7 (H) 04/08/2018 2336     Roxan Hockey M.D on 04/10/2018 at 5:27 PM  Between 7am to 7pm - Pager - [phone]  After 7pm go to [email or web address] - password [email or web address]  [facility] -  Office  [phone]   Voice Recognition Viviann Spare dictation system was used to create this note, attempts have been made to correct errors. Please contact the author with  questions and/or clarifications.

## 2018-04-11 ENCOUNTER — Inpatient Hospital Stay (HOSPITAL_COMMUNITY): Payer: Medicare Other

## 2018-04-11 LAB — BASIC METABOLIC PANEL
Anion gap: 9 (ref 5–15)
BUN: 10 mg/dL (ref 6–20)
CALCIUM: 8.5 mg/dL — AB (ref 8.9–10.3)
CO2: 26 mmol/L (ref 22–32)
Chloride: 92 mmol/L — ABNORMAL LOW (ref 101–111)
Creatinine, Ser: 0.69 mg/dL (ref 0.44–1.00)
GFR calc Af Amer: >60 mL/min (ref >60)
GLUCOSE: 108 mg/dL — AB (ref 65–99)
Potassium: 4.1 mmol/L (ref 3.5–5.1)
Sodium: 127 mmol/L — ABNORMAL LOW (ref 135–145)

## 2018-04-11 LAB — CBC
HCT: 30.3 % — ABNORMAL LOW (ref 36.0–46.0)
Hemoglobin: 9.3 g/dL — ABNORMAL LOW (ref 12.0–15.0)
MCH: 26.3 pg (ref 26.0–34.0)
MCHC: 30.7 g/dL (ref 30.0–36.0)
MCV: 85.6 fL (ref 78.0–100.0)
PLATELETS: 455 10*3/uL — AB (ref 150–400)
RBC: 3.54 MIL/uL — ABNORMAL LOW (ref 3.87–5.11)
RDW: 15.9 % — AB (ref 11.5–15.5)
WBC: 11.1 10*3/uL — ABNORMAL HIGH (ref 4.0–10.5)

## 2018-04-11 LAB — MAGNESIUM: Magnesium: 1.7 mg/dL (ref 1.7–2.4)

## 2018-04-11 MED ORDER — FUROSEMIDE 10 MG/ML IJ SOLN
20.0000 mg | Freq: Three times a day (TID) | INTRAMUSCULAR | Status: DC
  Administered 2018-04-11 – 2018-04-12 (×3): 20 mg via INTRAVENOUS
  Filled 2018-04-11 (×2): qty 2

## 2018-04-11 MED ORDER — DILTIAZEM HCL ER 60 MG PO CP12
120.0000 mg | ORAL_CAPSULE | Freq: Two times a day (BID) | ORAL | Status: DC
  Administered 2018-04-11: 120 mg via ORAL
  Filled 2018-04-11: qty 2

## 2018-04-11 MED ORDER — PHENOL 1.4 % MT LIQD
1.0000 | OROMUCOSAL | Status: DC | PRN
  Administered 2018-04-11 (×2): 1 via OROMUCOSAL
  Filled 2018-04-11: qty 177

## 2018-04-11 MED ORDER — FUROSEMIDE 10 MG/ML IJ SOLN
INTRAMUSCULAR | Status: AC
  Filled 2018-04-11: qty 2

## 2018-04-11 NOTE — Plan of Care (Signed)
Problem: Education: Goal: Knowledge of General Education information will improve Outcome: Progressing   Problem: Health Behavior/Discharge Planning: Goal: Ability to manage health-related needs will improve Outcome: Progressing   Problem: Clinical Measurements: Goal: Ability to maintain clinical measurements within normal limits will improve Outcome: Progressing Goal: Will remain free from infection Outcome: Progressing Goal: Diagnostic test results will improve Outcome: Progressing Goal: Respiratory complications will improve Outcome: Progressing Goal: Cardiovascular complication will be avoided Outcome: Progressing   Problem: Activity: Goal: Risk for activity intolerance will decrease Outcome: Progressing   Problem: Nutrition: Goal: Adequate nutrition will be maintained Outcome: Progressing   Problem: Coping: Goal: Level of anxiety will decrease Outcome: Progressing   Problem: Elimination: Goal: Will not experience complications related to bowel motility Outcome: Progressing Goal: Will not experience complications related to urinary retention Outcome: Progressing   Problem: Pain Managment: Goal: General experience of comfort will improve Outcome: Progressing   Problem: Safety: Goal: Ability to remain free from injury will improve Outcome: Progressing   Problem: Skin Integrity: Goal: Risk for impaired skin integrity will decrease Outcome: Progressing   Problem: Education: Goal: Knowledge of disease or condition will improve Outcome: Progressing Goal: Understanding of medication regimen will improve Outcome: Progressing   Problem: Activity: Goal: Ability to tolerate increased activity will improve Outcome: Progressing   Problem: Cardiac: Goal: Ability to achieve and maintain adequate cardiopulmonary perfusion will improve Outcome: Progressing   Problem: Health Behavior/Discharge Planning: Goal: Ability to safely manage health-related needs after  discharge will improve Outcome: Progressing

## 2018-04-11 NOTE — Progress Notes (Addendum)
Patient Demographics:    Cynthia Combs, is a 77 y.o. female, DOB - 12/16/1940, [id number]  Admit date - 04/08/2018   Admitting Physician Ivor Costa, MD  Outpatient Primary MD for the patient is Crist Infante, MD  LOS - 1   Chief Complaint  Patient presents with  . Sore Throat        Subjective:    Cynthia Combs today says throat is still real sore but ate a good breakfast.  Worried about swollen legs and face and neck.  No sob at this time.     Assessment  & Plan :    Principal Problem:   Strep pharyngitis Active Problems:   Anemia   Hyperlipemia   Hypertension   Hypothyroidism   Atrial fibrillation with RVR (HCC)   Sepsis (HCC)   Meningioma (HCC)   Rash  Brief summary 77 y.o. female with medical history significant of hypertension, hyperlipidemia, hypothyroidism, anemia, atrial fibrillation on Eliquis diagnosed with strep throat on 04/01/2018, failed outpatient Omnicef, admitted on 03/29/2018 with tachycardia, leukocytosis lactic acidosis recent concerns about sepsis   Plan:- 1) Sepsis secondary to streptococcal pharyngitis - told she had strep throat from her PCP's office, not getting better though as outpt after IM rocephin and po Omnicef, admitted here 5/31 - day #4 IV abx Rocephin will continue, some progress - CT neck from 04/06/2018 without abscess, CXR negative on admission - dc IVF"s may be developing vol overload/ chf - Blood cultures negative  2) Volume overload - due to fluid loading, has rales on exam but not symptomatic, has developed sig LE edema/ facial edema - echo here showed ef 55-60% mild lvh, could not estimate diast dysnfxn due to afib, no wma pulm htn 36mm Hg peak mod TR, reduced RV function.  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 apixaban  5 mg Oral BID  . calcium-vitamin D  1 tablet Oral QHS  . cholecalciferol  2,000 Units Oral QHS  . diltiazem  90 mg  Oral Q6H  . estradiol  0.5 mg Oral Q M,W,F  . folic acid  824 mcg Oral Daily  . irbesartan  150 mg Oral Daily  . levothyroxine  75 mcg Oral QAC breakfast  . metoprolol succinate  150 mg Oral Daily  . simvastatin  10 mg Oral QHS  . vitamin C  1,000 mg Oral QHS   Continuous Infusions: . cefTRIAXone (ROCEPHIN)  IV Stopped (04/11/18 1028)   PRN Meds:.acetaminophen, hydrALAZINE, menthol-cetylpyridinium, ondansetron **OR** ondansetron (ZOFRAN) IV, oxyCODONE-acetaminophen, phenol, polyethylene glycol, zolpidem    Anti-infectives (From admission, onward)   Start     Dose/Rate Route Frequency Ordered Stop   04/09/18 2200  cefTRIAXone (ROCEPHIN) 1 g in sodium chloride 0.9 % 100 mL IVPB  Status:  Discontinued     1 g 200 mL/hr over 30 Minutes Intravenous  Once 04/08/18 2239 04/09/18 1323    04/09/18 1400  cefTRIAXone (ROCEPHIN) 1 g in sodium chloride 0.9 % 100 mL IVPB     1 g 200 mL/hr over 30 Minutes Intravenous Every 12 hours 04/09/18 1323     04/08/18 2130  cefTRIAXone (ROCEPHIN) 1 g in sodium chloride 0.9 % 100 mL IVPB     1 g 200 mL/hr over 30 Minutes Intravenous  Once 04/08/18 2124 04/08/18 2322        Objective:   Vitals:   04/11/18 0001 04/11/18 0300 04/11/18 0700 04/11/18 0743  BP: 135/72 (!) 138/59 (!) 163/94 136/85  Pulse: 70 (!) 102 86 (!) 110  Resp: 15 17 17 15   Temp: 98.1 F (36.7 C) 98.4 F (36.9 C) (!) 97.5 F (36.4 C) 97.8 F (36.6 C)  TempSrc: Oral Oral Oral Oral  SpO2: 96% 97% (!) 88% 98%  Weight:      Height:        Wt Readings from Last 3 Encounters:  04/09/18 97.3 kg (214 lb 8.1 oz)  05/24/17 98.9 kg (218 lb)  04/19/17 97.7 kg (215 lb 6.4 oz)     Intake/Output Summary (Last 24 hours) at 04/11/2018 1214 Last data filed at 04/11/2018 1100 Gross per 24 hour  Intake 1260 ml  Output 350 ml  Net 910 ml    Physical Exam  Gen:- Awake Alert, in no apparent distress  HEENT:- [place].AT, No sclera icterus Throat-no significant erythema or exudates at this time Neck-Supple Neck,No JVD,.  Lungs- soft rales L side 1/2 up and R 1/3 up, no distress or ^wob CV- S1, S2 normal, irregularly irregular heart rate around 96 Abd-  +ve B.Sounds, Abd Soft, No tenderness,  Extremity/Skin:- 2+ bilat LE Edema, +ve   Pulses,  extensor side of upper extremities with nodular erythematous lesions, no fluctuance, no streaking Psych-affect is appropriate, oriented x3 Neuro-no new focal deficits, no tremors   Data Review:     CBC Recent Labs  Lab 04/06/18 1224 04/08/18 1954 04/09/18 0303 04/10/18 0742 04/11/18 0219  WBC 14.8* 13.8* 10.5 14.0* 11.1*  HGB 11.0* 10.8* 9.7* 9.9* 9.3*  HCT 34.9* 34.8* 31.1* 32.2* 30.3*  PLT 526* 570* 473* 458* 455*  MCV 83.9 85.1 85.2 85.4 85.6  MCH 26.4 26.4 26.6 26.3 26.3  MCHC 31.5 31.0 31.2 30.7 30.7  RDW 15.5 15.5 15.6*  15.8* 15.9*  LYMPHSABS  --  0.7  --   --   --   MONOABS  --  0.8  --   --   --   EOSABS  --  0.1  --   --   --   BASOSABS  --  0.0  --   --   --     Chemistries  Recent Labs  Lab 04/06/18 1224 04/08/18 1954 04/09/18 0303 04/09/18 1014 04/10/18 0742 04/11/18 0219  NA 131* 133* 133*  --  129* 127*  K 3.4* 3.5 3.0*  --  4.3 4.1  CL 90* 91* 95*  --  95* 92*  CO2 31 30 27   --  26 26  GLUCOSE 114* 157* 119*  --  117* 108*  BUN 12 10 9   --  11 10  CREATININE 0.78 0.95 0.78  --  0.69 0.69  CALCIUM 8.8* 8.9 8.2*  --  8.7* 8.5*  MG  --   --   --  1.4*  --  1.7  AST  --  24  --   --   --   --   ALT  --  24  --   --   --   --   ALKPHOS  --  155*  --   --   --   --   BILITOT  --  0.7  --   --   --   --    ------------------------------------------------------------------------------------------------------------------ Recent Labs    04/09/18 0303  CHOL 94  HDL 34*  LDLCALC 46  TRIG 69  CHOLHDL 2.8    Lab Results  Component Value Date   HGBA1C 5.6 04/09/2018   ------------------------------------------------------------------------------------------------------------------ Recent Labs    04/09/18 0303  TSH 1.413   ------------------------------------------------------------------------------------------------------------------ No results for input(s): VITAMINB12, FOLATE, FERRITIN, TIBC, IRON, RETICCTPCT in the last 72 hours.  Coagulation profile Recent Labs  Lab 04/08/18 1954  INR 1.53    No results for input(s): DDIMER in the last 72 hours.  Cardiac Enzymes Recent Labs  Lab 04/08/18 2336 04/09/18 0303 04/09/18 1014  TROPONINI <0.03 <0.03 <0.03   ------------------------------------------------------------------------------------------------------------------    Component Value Date/Time   BNP 154.7 (H) 04/08/2018 2336     Sandy Salaam Arrington Yohe M.D on 04/11/2018 at 12:14 PM  Between 7am to 7pm - Pager - [phone]  After 7pm go to [email or web address] -  password [email or web address]  [facility] -  Office  [phone]   Voice Recognition Viviann Spare dictation system was used to create this note, attempts have been made to correct errors. Please contact the author with questions and/or clarifications.

## 2018-04-11 NOTE — Progress Notes (Signed)
Pt. Still having discomfort to throat was offered a warm pack to aid in comfort.

## 2018-04-11 NOTE — Progress Notes (Addendum)
Patient awakened with c/o sore throat requested pain medication.  Pt. was given pain medication, cepacol lozenge, and warm pack to aid in comfort. Pain rated at 8 on scale.  Emotional support given.  Will monitor.

## 2018-04-12 DIAGNOSIS — E8770 Fluid overload, unspecified: Secondary | ICD-10-CM

## 2018-04-12 DIAGNOSIS — L52 Erythema nodosum: Secondary | ICD-10-CM

## 2018-04-12 DIAGNOSIS — R21 Rash and other nonspecific skin eruption: Secondary | ICD-10-CM

## 2018-04-12 DIAGNOSIS — J029 Acute pharyngitis, unspecified: Secondary | ICD-10-CM

## 2018-04-12 LAB — CBC
HEMATOCRIT: 34.8 % — AB (ref 36.0–46.0)
Hemoglobin: 10.5 g/dL — ABNORMAL LOW (ref 12.0–15.0)
MCH: 26.1 pg (ref 26.0–34.0)
MCHC: 30.2 g/dL (ref 30.0–36.0)
MCV: 86.4 fL (ref 78.0–100.0)
Platelets: 520 10*3/uL — ABNORMAL HIGH (ref 150–400)
RBC: 4.03 MIL/uL (ref 3.87–5.11)
RDW: 15.9 % — ABNORMAL HIGH (ref 11.5–15.5)
WBC: 14.6 10*3/uL — AB (ref 4.0–10.5)

## 2018-04-12 LAB — BASIC METABOLIC PANEL
Anion gap: 10 (ref 5–15)
BUN: 12 mg/dL (ref 6–20)
CALCIUM: 8.7 mg/dL — AB (ref 8.9–10.3)
CO2: 30 mmol/L (ref 22–32)
Chloride: 93 mmol/L — ABNORMAL LOW (ref 101–111)
Creatinine, Ser: 0.87 mg/dL (ref 0.44–1.00)
GFR calc Af Amer: >60 mL/min (ref >60)
GLUCOSE: 110 mg/dL — AB (ref 65–99)
POTASSIUM: 4.1 mmol/L (ref 3.5–5.1)
SODIUM: 133 mmol/L — AB (ref 135–145)

## 2018-04-12 MED ORDER — DILTIAZEM HCL-DEXTROSE 100-5 MG/100ML-% IV SOLN (PREMIX)
5.0000 mg/h | INTRAVENOUS | Status: DC
  Administered 2018-04-12: 5 mg/h via INTRAVENOUS
  Filled 2018-04-12: qty 100

## 2018-04-12 MED ORDER — FUROSEMIDE 10 MG/ML IJ SOLN
20.0000 mg | Freq: Two times a day (BID) | INTRAMUSCULAR | Status: DC
Start: 2018-04-12 — End: 2018-04-15
  Administered 2018-04-12 – 2018-04-15 (×6): 20 mg via INTRAVENOUS
  Filled 2018-04-12 (×6): qty 2

## 2018-04-12 MED ORDER — PREDNISONE 20 MG PO TABS
40.0000 mg | ORAL_TABLET | Freq: Every day | ORAL | Status: DC
  Administered 2018-04-12 – 2018-04-15 (×4): 40 mg via ORAL
  Filled 2018-04-12 (×4): qty 2

## 2018-04-12 MED ORDER — DILTIAZEM LOAD VIA INFUSION
10.0000 mg | Freq: Once | INTRAVENOUS | Status: AC
  Administered 2018-04-12: 10 mg via INTRAVENOUS
  Filled 2018-04-12: qty 10

## 2018-04-12 MED ORDER — METOPROLOL TARTRATE 5 MG/5ML IV SOLN
INTRAVENOUS | Status: AC
  Filled 2018-04-12: qty 5

## 2018-04-12 MED ORDER — METOPROLOL TARTRATE 5 MG/5ML IV SOLN
5.0000 mg | Freq: Once | INTRAVENOUS | Status: AC
  Administered 2018-04-12: 5 mg via INTRAVENOUS

## 2018-04-12 MED ORDER — DILTIAZEM HCL 60 MG PO TABS
90.0000 mg | ORAL_TABLET | Freq: Four times a day (QID) | ORAL | Status: DC
  Administered 2018-04-12 – 2018-04-15 (×13): 90 mg via ORAL
  Filled 2018-04-12 (×13): qty 2

## 2018-04-12 MED ORDER — AMLODIPINE BESYLATE 5 MG PO TABS
5.0000 mg | ORAL_TABLET | Freq: Every day | ORAL | Status: DC
  Administered 2018-04-12 – 2018-04-15 (×4): 5 mg via ORAL
  Filled 2018-04-12 (×4): qty 1

## 2018-04-12 NOTE — Progress Notes (Signed)
Patient Demographics:    Cynthia Combs, is a 77 y.o. female, DOB - 25-Jul-1941, [id number]  Admit date - 04/08/2018   Admitting Physician Ivor Costa, MD  Outpatient Primary MD for the patient is Crist Infante, MD  LOS - 2   Chief Complaint  Patient presents with  . Sore Throat        Subjective:    Cynthia Combs today says throat is still hurting very badly, difficult to eat / swallow. Percocet helped some.    Assessment  & Plan :    Principal Problem:   Strep pharyngitis Active Problems:   Anemia   Hyperlipemia   Hypertension   Hypothyroidism   Atrial fibrillation with RVR (HCC)   Sepsis (HCC)   Meningioma (HCC)   Rash  Brief summary 77 y.o. female with medical history significant of hypertension, hyperlipidemia, hypothyroidism, anemia, atrial fibrillation on Eliquis diagnosed with strep throat on 04/01/2018, failed outpatient Omnicef, admitted on 03/29/2018 with tachycardia, leukocytosis lactic acidosis recent concerns about sepsis   Plan:- 1) Sepsis secondary to streptococcal pharyngitis - told she had strep throat from her PCP's office, failed outpt Rx (IM rocephin and oral ceph), admitted here 5/31 w [facility] possible sepsis and afib w/ RVR - day #5 IV abx Rocephin, still w sore throat; negative exam today; spoke w dr Linus Salmons by phone recommended dc abx and try course of steroids,  will dc abx and start prednisone 40 qd; also there are reports of ARB causing pharyngeal edema will dc ARB - CT neck from 04/06/2018 without abscess, CXR negative on admission - Blood cultures negative - possible erythema nodosum UE's/ chest, improving  2) Volume overload/ LVH/ pulm HTN/ RV dysfunction by echo - got lots of IVF"s on admission, CXR negative 6/3 and echo here > ef 55-60% mild lvh RV dysfxn and pulm htn - very good diuresis yest  w 4.5 L uop, looks better still edematous will decrease IV lasix to  20 bid - clear CXR, mostly R sided vol excess could be c/w some degree RV failure - check bmet am  3) Afib with RVR- chronic afib on Toprol-XL 150 qd and eliquis at home - improved with IV diltiazem then switched to 90 qid then yest switched to 120 xl bid >  last night lost rate control again and IV diltiazem restarted; will restart dilt 90 qid and wean drip as tolerated; call cardiology if not improving 12-24 hrs - echo showed preserved EF over 55% - cont eliquis  4) Anemia - Hb 11 > 9.3 here, prob due to volume loading/ hemodilution + lab draws - diuresing should help this some , Hb up today 10.5  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 12/18/2016, continue levothyroxine recheck TSH   6) 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--outpatient follow-up with neurosurgery advised  7) Upper extremity rash suspicious for erythema nodosum - improving  8) HTN   - continue Toprol XL 150 qd as at home  - will dc ARB (irbesartan) due to reports of pharyngeal edema  - will start norvasc 5 qd  - getting IV lasix here , resume po lasix 20 mg qd as at home when adequately diuresed  - dc HCTZ doesn't need with  loop diuretic    [name] MD [facility] pgr [phone] 04/03/2018, 9:25 AM    Code Status : Full Disposition Plan  : home DVT Prophylaxis  :  Eliquis  Lab Results  Component Value Date   PLT 520 (H) 04/12/2018   Inpatient Medications  Scheduled Meds: . apixaban  5 mg Oral BID  . calcium-vitamin D  1 tablet Oral QHS  . cholecalciferol  2,000 Units Oral QHS  . estradiol  0.5 mg Oral Q M,W,F  . folic acid  967 mcg Oral Daily  . furosemide  20 mg Intravenous Q12H  . irbesartan  150 mg Oral Daily  . levothyroxine  75 mcg Oral QAC breakfast  . metoprolol succinate  150 mg Oral Daily  . simvastatin  10 mg Oral QHS  . vitamin C  1,000 mg Oral QHS   Continuous Infusions: . cefTRIAXone (ROCEPHIN)  IV Stopped  (04/11/18 2213)  . diltiazem (CARDIZEM) infusion 12.5 mg/hr (04/12/18 1007)   PRN Meds:.acetaminophen, hydrALAZINE, menthol-cetylpyridinium, ondansetron **OR** ondansetron (ZOFRAN) IV, oxyCODONE-acetaminophen, phenol, polyethylene glycol, zolpidem    Anti-infectives (From admission, onward)   Start     Dose/Rate Route Frequency Ordered Stop   04/09/18 2200  cefTRIAXone (ROCEPHIN) 1 g in sodium chloride 0.9 % 100 mL IVPB  Status:  Discontinued     1 g 200 mL/hr over 30 Minutes Intravenous  Once 04/08/18 2239 04/09/18 1323   04/09/18 1400  cefTRIAXone (ROCEPHIN) 1 g in sodium chloride 0.9 % 100 mL IVPB     1 g 200 mL/hr over 30 Minutes Intravenous Every 12 hours 04/09/18 1323     04/08/18 2130  cefTRIAXone (ROCEPHIN) 1 g in sodium chloride 0.9 % 100 mL IVPB     1 g 200 mL/hr over 30 Minutes Intravenous  Once 04/08/18 2124 04/08/18 2322        Objective:   Vitals:   04/12/18 0335 04/12/18 0500 04/12/18 0730 04/12/18 0744  BP: 130/74  (!) 168/101 (!) 158/97  Pulse: (!) 103  (!) 139   Resp: 17  17 (!) 22  Temp:  98.2 F (36.8 C) 98.5 F (36.9 C)   TempSrc:  Oral Oral   SpO2: 97%  98%   Weight:  97.6 kg (215 lb 3.2 oz)    Height:        Wt Readings from Last 3 Encounters:  04/12/18 97.6 kg (215 lb 3.2 oz)  05/24/17 98.9 kg (218 lb)  04/19/17 97.7 kg (215 lb 6.4 oz)     Intake/Output Summary (Last 24 hours) at 04/12/2018 1018 Last data filed at 04/12/2018 1007 Gross per 24 hour  Intake 1053.75 ml  Output 5350 ml  Net -4296.25 ml    Physical Exam  Gen:- Awake Alert, in no apparent distress  HEENT:- [place].AT, No sclera icterus Throat-no significant erythema or exudates at this time Neck-Supple Neck,No JVD,.  Lungs- soft rales L side 1/2 up and R 1/3 up, no distress or ^wob CV- S1, S2 normal, irregularly irregular heart rate around 96 Abd-  +ve B.Sounds, Abd Soft, No tenderness,  Extremity/Skin:- 2+ bilat LE Edema, +ve   Pulses,  extensor side of upper extremities with  nodular erythematous lesions, no fluctuance, no streaking Psych-affect is appropriate, oriented x3 Neuro-no new focal deficits, no tremors   Data Review:     CBC Recent Labs  Lab 04/08/18 1954 04/09/18 0303 04/10/18 0742 04/11/18 0219 04/12/18 0224  WBC 13.8* 10.5 14.0* 11.1* 14.6*  HGB 10.8* 9.7* 9.9*  9.3* 10.5*  HCT 34.8* 31.1* 32.2* 30.3* 34.8*  PLT 570* 473* 458* 455* 520*  MCV 85.1 85.2 85.4 85.6 86.4  MCH 26.4 26.6 26.3 26.3 26.1  MCHC 31.0 31.2 30.7 30.7 30.2  RDW 15.5 15.6* 15.8* 15.9* 15.9*  LYMPHSABS 0.7  --   --   --   --   MONOABS 0.8  --   --   --   --   EOSABS 0.1  --   --   --   --   BASOSABS 0.0  --   --   --   --     Chemistries  Recent Labs  Lab 04/08/18 1954 04/09/18 0303 04/09/18 1014 04/10/18 0742 04/11/18 0219 04/12/18 0224  NA 133* 133*  --  129* 127* 133*  K 3.5 3.0*  --  4.3 4.1 4.1  CL 91* 95*  --  95* 92* 93*  CO2 30 27  --  26 26 30   GLUCOSE 157* 119*  --  117* 108* 110*  BUN 10 9  --  11 10 12   CREATININE 0.95 0.78  --  0.69 0.69 0.87  CALCIUM 8.9 8.2*  --  8.7* 8.5* 8.7*  MG  --   --  1.4*  --  1.7  --   AST 24  --   --   --   --   --   ALT 24  --   --   --   --   --   ALKPHOS 155*  --   --   --   --   --   BILITOT 0.7  --   --   --   --   --    ------------------------------------------------------------------------------------------------------------------ No results for input(s): CHOL, HDL, LDLCALC, TRIG, CHOLHDL, LDLDIRECT in the last 72 hours.  Lab Results  Component Value Date   HGBA1C 5.6 04/09/2018   ------------------------------------------------------------------------------------------------------------------ No results for input(s): TSH, T4TOTAL, T3FREE, THYROIDAB in the last 72 hours.  Invalid input(s): FREET3 ------------------------------------------------------------------------------------------------------------------ No results for input(s): VITAMINB12, FOLATE, FERRITIN, TIBC, IRON, RETICCTPCT in the  last 72 hours.  Coagulation profile Recent Labs  Lab 04/08/18 1954  INR 1.53    No results for input(s): DDIMER in the last 72 hours.  Cardiac Enzymes Recent Labs  Lab 04/08/18 2336 04/09/18 0303 04/09/18 1014  TROPONINI <0.03 <0.03 <0.03   ------------------------------------------------------------------------------------------------------------------    Component Value Date/Time   BNP 154.7 (H) 04/08/2018 2336     Sandy Salaam Yassen Kinnett M.D on 04/12/2018 at 10:18 AM  Between 7am to 7pm - Pager - [phone]  After 7pm go to [email or web address] - password [email or web address]  [facility] -  Office  [phone]   Voice Recognition Viviann Spare dictation system was used to create this note, attempts have been made to correct errors. Please contact the author with questions and/or clarifications.

## 2018-04-13 DIAGNOSIS — I481 Persistent atrial fibrillation: Secondary | ICD-10-CM

## 2018-04-13 DIAGNOSIS — I10 Essential (primary) hypertension: Secondary | ICD-10-CM

## 2018-04-13 DIAGNOSIS — L52 Erythema nodosum: Secondary | ICD-10-CM

## 2018-04-13 LAB — CBC
HCT: 31.9 % — ABNORMAL LOW (ref 36.0–46.0)
Hemoglobin: 9.9 g/dL — ABNORMAL LOW (ref 12.0–15.0)
MCH: 26.4 pg (ref 26.0–34.0)
MCHC: 31 g/dL (ref 30.0–36.0)
MCV: 85.1 fL (ref 78.0–100.0)
Platelets: 481 10*3/uL — ABNORMAL HIGH (ref 150–400)
RBC: 3.75 MIL/uL — ABNORMAL LOW (ref 3.87–5.11)
RDW: 15.7 % — ABNORMAL HIGH (ref 11.5–15.5)
WBC: 13 10*3/uL — ABNORMAL HIGH (ref 4.0–10.5)

## 2018-04-13 LAB — BASIC METABOLIC PANEL
Anion gap: 9 (ref 5–15)
BUN: 12 mg/dL (ref 6–20)
CO2: 29 mmol/L (ref 22–32)
Calcium: 8.8 mg/dL — ABNORMAL LOW (ref 8.9–10.3)
Chloride: 95 mmol/L — ABNORMAL LOW (ref 101–111)
Creatinine, Ser: 0.69 mg/dL (ref 0.44–1.00)
GFR calc Af Amer: >60 mL/min (ref >60)
GFR calc non Af Amer: >60 mL/min (ref >60)
Glucose, Bld: 126 mg/dL — ABNORMAL HIGH (ref 65–99)
Potassium: 3.8 mmol/L (ref 3.5–5.1)
Sodium: 133 mmol/L — ABNORMAL LOW (ref 135–145)

## 2018-04-13 LAB — CULTURE, BLOOD (ROUTINE X 2)
CULTURE: NO GROWTH
Culture: NO GROWTH

## 2018-04-13 MED ORDER — LABETALOL HCL 5 MG/ML IV SOLN: 5.0000 mg | INTRAVENOUS | Status: DC | PRN

## 2018-04-13 NOTE — Care Management Important Message (Signed)
Important Message  Patient Details  Name: Cynthia Combs MRN: [id number] Date of Birth: 1941-09-01   Medicare Important Message Given:  Yes    Cynthia Combs 04/13/2018, 1:09 PM

## 2018-04-13 NOTE — Consult Note (Addendum)
Cardiology Consultation:   Patient ID: Cynthia Combs; [id number]; 1941-06-27   Admit date: 04/08/2018 Date of Consult: 04/13/2018  Primary Care Provider: Crist Infante, MD Primary Cardiologist: new to [place]    Patient Profile:   Cynthia Combs is a 77 y.o. female with a hx of persistent atrial fibrillation on Eliquis, hypertension, hyperlipidemia and hypothyroidism who is being seen today for the evaluation of atrial fibrillation with RVR at the request of Cynthia Combs.  She followed in atrial fibrillation clinic.  She initially found in A. fib by PCP and referred to A. fib clinic.  Echo with normal LV function.  Anticoagulated with Eliquis.  Offered cardioversion however declined stating that her father and aunt both has failed cardioversion.  Commended follow-up with general cardiology however she declined as well.  She was asymptomatic with A. fib.  History of Present Illness:   Cynthia Combs admitted 6/1 due to sepsis secondary to strep pharyngitis and A. fib RVR.  Patient initially diagnosed with strep throat 2 days ago and treated with antibiotic however her symptoms persisted.  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  Due to ongoing sore throat, PCP sent her to ER for further evaluation.  She was found leukocytosis and elevated lactic acid.  She was admitted for sepsis and treatment and plan.  She remained afebrile.  Treated with antibiotic.  Patient initially treated with Cardizem gtt now on p.o. short-acting Cardizem.  Rate fluctuates between 90s to 130s with minimal exertion.  Cardiology is asked for further treatment and plan.  She denies orthopnea, PND, syncope, dizziness or melena.  She has lower extremity edema since admitted. Home lasix on and HCTZ on hold. Continues to have mild cough.  Echocardiogram this admission showed LV function of 55 to 60%, no wall motion abnormality  no significant valvular abnormality noted.  Past Medical History:  Diagnosis Date  . Anemia   . Atrial fibrillation ([place])   . Cyst    HX of   . Hemorrhoids   . Hyperlipemia   . Hypertension   . Hyperthyroidism   . Thyroid nodule     Past Surgical History:  Procedure Laterality Date  . ABDOMINAL EXPLORATION SURGERY    . ABDOMINAL HYSTERECTOMY    . APPENDECTOMY    . CHOLECYSTECTOMY    . COLON SURGERY     Due to large cyst/tumor--Benign  . DILATION AND CURETTAGE OF UTERUS    . INCISE AND DRAIN ABCESS     left hand  . KNEE SURGERY     Right      Inpatient Medications: Scheduled Meds: . amLODipine  5 mg Oral Daily  . apixaban  5 mg Oral BID  . calcium-vitamin D  1 tablet Oral QHS  . cholecalciferol  2,000 Units Oral QHS  . diltiazem  90 mg Oral Q6H  . estradiol  0.5 mg Oral Q M,W,F  . folic acid  414 mcg Oral Daily  . furosemide  20 mg Intravenous Q12H  . levothyroxine  75 mcg Oral QAC breakfast  . metoprolol succinate  150 mg Oral Daily  . predniSONE  40 mg Oral Q breakfast  . simvastatin  10 mg Oral QHS  . vitamin C  1,000 mg Oral QHS   Continuous Infusions: . diltiazem (CARDIZEM) infusion Stopped (04/12/18 1206)   PRN Meds: acetaminophen, hydrALAZINE, labetalol, menthol-cetylpyridinium, ondansetron **OR** ondansetron (ZOFRAN) IV, oxyCODONE-acetaminophen, phenol, polyethylene glycol, zolpidem  Allergies:    Allergies  Allergen Reactions  . Crestor [Rosuvastatin Calcium] Other (See Comments)    Muscle weakness  . Lisinopril Other (See Comments)    Unknown reaction - reported by [facility]  . Niacin And Related Other (See Comments)    Facial redness  . Other Other (See Comments)    Passed out after swine flu shot, couldn't move for several days  . Prevnar 13 [Pneumococcal 13-Val Conj Vacc] Other (See Comments)    Arm tingled for months after administration  . Singulair [Montelukast Sodium] Other (See Comments)    Blood pressure increased      Social History:   Social History   Socioeconomic History  . Marital status: Divorced    Spouse name: Not on file  . Number of children: 2  . Years of education: Not on file  . Highest education level: Not on file  Occupational History  . Occupation: Therapist, sports     Comment: Retired   Scientific laboratory technician  . Financial resource strain: Not on file  . Food insecurity:    Worry: Not on file    Inability: Not on file  . Transportation needs:    Medical: Not on file    Non-medical: Not on file  Tobacco Use  . Smoking status: Former Research scientist (life sciences)  . Smokeless tobacco: Never Used  Substance and Sexual Activity  . Alcohol use: No  . Drug use: No  . Sexual activity: Not on file  Lifestyle  . Physical activity:    Days per week: Not on file    Minutes per session: Not on file  . Stress: Not on file  Relationships  . Social connections:    Talks on phone: Not on file    Gets together: Not on file    Attends religious service: Not on file    Active member of club or organization: Not on file    Attends meetings of clubs or organizations: Not on file    Relationship status: Not on file  . Intimate partner violence:    Fear of current or ex partner: Not on file    Emotionally abused: Not on file    Physically abused: Not on file    Forced sexual activity: Not on file  Other Topics Concern  . Not on file  Social History Narrative  . Not on file    Family History:   Family History  Problem Relation Age of Onset  . Colon polyps Mother   . Heart disease Mother   . Colon polyps Sister   . Breast cancer Maternal Aunt   . Kidney disease Father   . Colon cancer Neg Hx      ROS:  Please see the history of present illness.  All other ROS reviewed and negative.     Physical Exam/Data:   Vitals:   04/12/18 1936 04/12/18 2329 04/13/18 0324 04/13/18 0746  BP: 129/61 136/63 (!) 153/68 139/68  Pulse: 86   98  Resp: 15   15  Temp: 98.4 F (36.9 C) 98.1 F (36.7 C) 97.6 F (36.4 C) 98.3 F  (36.8 C)  TempSrc: Oral Oral Oral Oral  SpO2: 96%   99%  Weight:   212 lb 6.4 oz (96.3 kg)   Height:        Intake/Output Summary (Last 24 hours) at 04/13/2018 1108 Last data filed at 04/13/2018 0943 Gross per 24 hour  Intake 240 ml  Output 5900 ml  Net -5660 ml  Filed Weights   04/11/18 1251 04/12/18 0500 04/13/18 0324  Weight: 223 lb (101.2 kg) 215 lb 3.2 oz (97.6 kg) 212 lb 6.4 oz (96.3 kg)   Body mass index is 28.02 kg/m.  General:  Well nourished, well developed, in no acute distress HEENT: normal Lymph: no adenopathy Neck: no JVD Endocrine:  No thryomegaly Vascular: No carotid bruits; FA pulses 2+ bilaterally without bruits  Cardiac:  normal S1, S2; IR IR ; no murmur  Lungs:  clear to auscultation bilaterally, no wheezing, rhonchi or rales  Abd: soft, nontender, no hepatomegaly  Ext: trace to 1 + BL LE edema Musculoskeletal:  No deformities, BUE and BLE strength normal and equal Skin: warm and dry  Neuro:  CNs 2-12 intact, no focal abnormalities noted Psych:  Normal affect   EKG:  The EKG was personally reviewed and demonstrates:  afib at rate of 156 bpm Telemetry:  Telemetry was personally reviewed and demonstrates:  AFib at rate of 90-130s  Relevant CV Studies:  Echo 04/09/2018 Study Conclusions  - Left ventricle: The cavity size was normal. Wall thickness was   increased in a pattern of mild LVH. Systolic function was normal.   The estimated ejection fraction was in the range of 55% to 60%.   Wall motion was normal; there were no regional wall motion   abnormalities. The study was not technically sufficient to allow   evaluation of LV diastolic dysfunction due to atrial   fibrillation. - Aortic valve: Mildly calcified annulus. Trileaflet. - Mitral valve: There was mild regurgitation. - Right ventricle: Systolic function was reduced. - Right atrium: The atrium was mildly dilated. - Atrial septum: No defect or patent foramen ovale was identified. - Tricuspid  valve: There was moderate regurgitation. - Pulmonic valve: There was mild regurgitation. - Pulmonary arteries: PA peak pressure: 55 mm Hg (S). - Systemic veins: Dilated IVC with normal respiratory variation.   Estimated CVP 8 mmHg. - Pericardium, extracardiac: A small pericardial effusion was   identified. Features were not consistent with tamponade   physiology.  Laboratory Data:  Chemistry Recent Labs  Lab 04/11/18 0219 04/12/18 0224 04/13/18 0243  NA 127* 133* 133*  K 4.1 4.1 3.8  CL 92* 93* 95*  CO2 '26 30 29  ' GLUCOSE 108* 110* 126*  BUN '10 12 12  ' CREATININE 0.69 0.87 0.69  CALCIUM 8.5* 8.7* 8.8*  GFRNONAA >60 >60 >60  GFRAA >60 >60 >60  ANIONGAP '9 10 9    ' Recent Labs  Lab 04/08/18 1954  PROT 6.8  ALBUMIN 3.1*  AST 24  ALT 24  ALKPHOS 155*  BILITOT 0.7   Hematology Recent Labs  Lab 04/11/18 0219 04/12/18 0224 04/13/18 0243  WBC 11.1* 14.6* 13.0*  RBC 3.54* 4.03 3.75*  HGB 9.3* 10.5* 9.9*  HCT 30.3* 34.8* 31.9*  MCV 85.6 86.4 85.1  MCH 26.3 26.1 26.4  MCHC 30.7 30.2 31.0  RDW 15.9* 15.9* 15.7*  PLT 455* 520* 481*   Cardiac Enzymes Recent Labs  Lab 04/08/18 2336 04/09/18 0303 04/09/18 1014  TROPONINI <0.03 <0.03 <0.03   BNP Recent Labs  Lab 04/08/18 2336  BNP 154.7*   Radiology/Studies:  Dg Chest 2 View  Result Date: 04/11/2018 CLINICAL DATA:  Congestive heart failure.  Follow-up. EXAM: CHEST - 2 VIEW COMPARISON:  04/08/2018 FINDINGS: Cardiomegaly as seen previously. Aortic atherosclerosis. Lungs remain clear. The vascularity is normal. Tiny effusions in the posterior costophrenic angles. No significant bone finding. IMPRESSION: Cardiomegaly and aortic atherosclerosis. Tiny effusions in the  posterior costophrenic angles. No edema. Electronically Signed   By: Nelson Chimes M.D.   On: 04/11/2018 13:51   Assessment and Plan:   1. Persistent atrial fibrillation -Fluctuating rate between 90-130s.  Initially treated with IV Cardizem now on p.o.   She was taking metoprolol succinate 150 mg daily.  Previously declined cardioversion stating her father and aunt both has failed cardioversion.  She has not missed any single dose of Eliquis. -Currently on Toprol-XL 150 mg and Cardizem 90 mg every 6 hours.  Continue current medication.  Her rapid ventricular rate is due to underlying infection and sepsis.  There is a chance that she has a recurrent A. fib if cardioversion performed this admission. This will improves with resolution of infection.  - May convert Cardizem to long-acting at discharge.  Follow-up in A. fib clinic afterwards.  If still remains elevated consider cardioversion.  N -TSH normal.  Echocardiogram with normal LV function. CHADSVASC score of 4. Continue eliquis.   2.  Lower extremity edema -Likely due to elevated heart rate.  Continue Lasix.  Resume hydrochlorothiazide at discharge.  3.  Hypertension -Blood pressure stable.  4.  Possible meningioma -Recommended neurological work-up.  5. Chronic anemia - Per primary team. Denies any bleeding issue.   For questions or updates, please contact [place] Please consult [email or web address] for contact info under Cardiology/STEMI.   Mahalia Longest [place], [place]  04/13/2018 11:08 AM   Attending Note:   The patient was seen and examined.  Agree with assessment and plan as noted above.  Changes made to the above note as needed.  I met the patient 20+ years ago - I took care of her mother Izell [place] Self) who I remember very well.   Patient seen and independently examined with Robbie Lis, PA .   We discussed all aspects of the encounter. I agree with the assessment and plan as stated above.  1.  Atrial fibrillation: Patient presents with atrial fibrillation.  This is been exacerbated by streptococcal pharyngitis. She has had diltiazem added to her medical regimen and her heart rate is fairly well controlled.  We can consider increasing the Toprol slightly but I suspect that  her heart rate will improve as we generally get the strep infection under control.  Continue  Eliquis for now.  At this point I would not consider cardioversion.  Until her strep infection is adequately treated, I think that the atrial fibrillation would recur.  In addition, she was just found to have a mass in her brain.  She may need to have surgery on that.  If we cardiovert her during this admission, we would need to continue Eliquis for at least 3 weeks.  While there is no urgent indication for brain surgery now, I would like to leave that option open.  She is completely asymptomatic from a cardiac standpoint.   she cannot tell that her heart rate is irregular.    I have spent a total of 40 minutes with patient reviewing hospital  notes , telemetry, EKGs, labs and examining patient as well as establishing an assessment and plan that was discussed with the patient. > 50% of time was spent in direct patient care.    Thayer Headings, Brooke Bonito., MD, [facility] 04/13/2018, 11:41 AM [address]  [place] Pager [phone]

## 2018-04-13 NOTE — Progress Notes (Signed)
PROGRESS NOTE    Cynthia Combs  KZS:[id number] DOB: [birth year] DOA: 04/08/2018 PCP: Crist Infante, MD    Brief Narrative:  77 y.o. female with medical history significant of hypertension, hyperlipidemia, hypothyroidism, anemia, atrial fibrillation on Eliquis, who presents with sore throat and patient states that rash.  Pt states that because of sore throat, she was diagnosed as strep throat 2 weeks ago by PCP.  She was given 1 intramuscular Rocephin injection, then started with Cefdinir. She states that she does not have significant improvement.  She continues to have throat pain, which is sharp, scratching pain, 4 out of 10 severity, nonradiating.  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 Her doctor is concerned that patient may have sepsis, therefore sent her to emergency room for further evaluation and treatment.  Patient states that she does not have fever or chills.  She has mild dry cough, but no shortness of breath or chest pain.  She is mildly constipated, no nausea, vomiting, diarrhea, abdominal pain, symptoms of UTI.  Patient states that 2 days ago she developed nodular rash in both arms, which is pink-colored, not itchy, but mildly painful.  ED Course: pt was found to have WBC 13.8, lactic acid 2.71, electrolytes renal function okay, temperature normal, A. fib with RVR, EKG, oxygen saturation 96% on room air, chest x-ray negative.  Patient is placed on stepdown bed for observation.  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  Now  developed sepsis with leukocytosis, tachycardia and tachypnea.  Lactic acid elevated 2.71. Remains stable at this time -continued on IV Rocephin -PRN Cepacol as needd -will get Procalcitonin and trend lactic acid levels per sepsis protocol. -afebrile  Atrial fibrillation with RVR: This is most likely triggered by sepsis and ongoing infection. CHA2DS2-VASc Score is 4, needs oral anticoagulation. Patient is onEliquis at home. Required cardizem gtt with cardizem transitioned to PO overnight. Still in RVR this AM -Continue Eliquis -Continue home metoprolol -Patient continued on 90mg  PO cardizem q6hrs -Will start PRN labetalol for HR>120 -Given continued RVR, will consult Cardiology for further assistance  Anemia: Hemoglobin stable, 10.8 -repeat cbc in AM  HLD: -Continue Zocor as tolerated  HTN:  -Continue home medications: Metoprolol, irbesartan -hold HCTZ and lasix due to sepsis -continue with IV hydralazine as tolerated  Hypothyroidism: Last TSH was 1.650 on 03/30/17 -Continue with home Synthroid  Possible meningioma ([place]): This is incidental finding. -Neurologically stable -Consider follow up with Neurosurgery as outpatient  Rash: Unclear etiology, not sure but diagnosis, possible erythema nodosum? -Recommend follow-up with dermatologist   DVT prophylaxis: Eliquis Code Status: Full Family Communication: Pt in room, family not at bedside Disposition Plan: Uncertain at this time  Consultants:   Cardiology  Procedures:     Antimicrobials: Anti-infectives (From admission, onward)   Start     Dose/Rate Route Frequency Ordered Stop   04/09/18 2200  cefTRIAXone (ROCEPHIN) 1 g in sodium chloride 0.9 % 100 mL IVPB  Status:  Discontinued     1 g 200 mL/hr over 30 Minutes Intravenous  Once 04/08/18 2239 04/09/18 1323   04/09/18 1400  cefTRIAXone (ROCEPHIN) 1 g in sodium chloride 0.9 % 100 mL IVPB  Status:  Discontinued     1 g 200 mL/hr over 30 Minutes Intravenous  Every 12 hours 04/09/18 1323 04/12/18 1044   04/08/18 2130  cefTRIAXone (ROCEPHIN) 1 g in sodium chloride 0.9 % 100 mL IVPB     1 g 200 mL/hr over 30 Minutes Intravenous  Once 04/08/18 2124 04/08/18 2322       Subjective: Without chest pain or sob  Objective: Vitals:   04/12/18 1936 04/12/18 2329 04/13/18 0324 04/13/18 0746  BP: 129/61 136/63 (!) 153/68 139/68  Pulse: 86   98  Resp: 15   15  Temp: 98.4 F (36.9 C) 98.1 F (36.7 C) 97.6 F (36.4 C) 98.3 F (36.8 C)  TempSrc: Oral Oral Oral Oral  SpO2: 96%   99%  Weight:   96.3 kg (212 lb 6.4 oz)   Height:        Intake/Output Summary (Last 24 hours) at 04/13/2018 1004 Last data filed at 04/13/2018 0943 Gross per 24 hour  Intake 480 ml  Output 6750 ml  Net -6270 ml   Filed Weights   04/11/18 1251 04/12/18 0500 04/13/18 0324  Weight: 101.2 kg (223 lb) 97.6 kg (215 lb 3.2 oz) 96.3 kg (212 lb 6.4 oz)    Examination:  General exam: Appears calm and comfortable  Respiratory system: Clear to auscultation. Respiratory effort normal. Cardiovascular system: S1 & S2 heard, tachycardic, s1, s2, irregularly irregular Gastrointestinal system: Abdomen is nondistended, soft and nontender. No organomegaly or masses felt. Normal bowel sounds heard. Central nervous system: Alert and oriented. No focal neurological deficits. Extremities: Symmetric 5 x 5 power. Skin: No rashes, lesions  Psychiatry: Judgement and insight appear normal. Mood & affect appropriate.   Data Reviewed: I have personally reviewed following labs and imaging studies  CBC: Recent Labs  Lab 04/08/18 1954 04/09/18 0303 04/10/18 0742 04/11/18 0219 04/12/18 0224 04/13/18 0243  WBC 13.8* 10.5 14.0* 11.1* 14.6* 13.0*  NEUTROABS 12.1*  --   --   --   --   --   HGB 10.8* 9.7* 9.9* 9.3* 10.5* 9.9*  HCT 34.8* 31.1* 32.2* 30.3* 34.8* 31.9*  MCV 85.1 85.2 85.4 85.6 86.4 85.1  PLT 570* 473* 458* 455* 520* 202*   Basic Metabolic Panel: Recent Labs  Lab  04/09/18 0303 04/09/18 1014 04/10/18 0742 04/11/18 0219 04/12/18 0224 04/13/18 0243  NA 133*  --  129* 127* 133* 133*  K 3.0*  --  4.3 4.1 4.1 3.8  CL 95*  --  95* 92* 93* 95*  CO2 27  --  26 26 30 29   GLUCOSE 119*  --  117* 108* 110* 126*  BUN 9  --  11 10 12 12   CREATININE 0.78  --  0.69 0.69 0.87 0.69  CALCIUM 8.2*  --  8.7* 8.5* 8.7* 8.8*  MG  --  1.4*  --  1.7  --   --    GFR: Estimated Creatinine Clearance: 79.1 mL/min (by C-G formula based on SCr of 0.69 mg/dL). Liver Function Tests: Recent Labs  Lab 04/08/18 1954  AST 24  ALT 24  ALKPHOS 155*  BILITOT 0.7  PROT 6.8  ALBUMIN 3.1*   No results for input(s): LIPASE, AMYLASE in the last 168 hours. No results for input(s): AMMONIA in the last 168 hours. Coagulation Profile: Recent Labs  Lab 04/08/18 1954  INR 1.53  Cardiac Enzymes: Recent Labs  Lab 04/08/18 2336 04/09/18 0303 04/09/18 1014  TROPONINI <0.03 <0.03 <0.03   BNP (last 3 results) No results for input(s): PROBNP in the last 8760 hours. HbA1C: No results for input(s): HGBA1C in the last 72 hours. CBG: No results for input(s): GLUCAP in the last 168 hours. Lipid Profile: No results for input(s): CHOL, HDL, LDLCALC, TRIG, CHOLHDL, LDLDIRECT in the last 72 hours. Thyroid Function Tests: No results for input(s): TSH, T4TOTAL, FREET4, T3FREE, THYROIDAB in the last 72 hours. Anemia Panel: No results for input(s): VITAMINB12, FOLATE, FERRITIN, TIBC, IRON, RETICCTPCT in the last 72 hours. Sepsis Labs: Recent Labs  Lab 04/08/18 2037 04/08/18 2151 04/08/18 2336 04/09/18 0303  PROCALCITON  --   --  <0.10  --   LATICACIDVEN 2.71* 0.86 1.4 1.3    Recent Results (from the past 240 hour(s))  Culture, blood (Routine x 2)     Status: None (Preliminary result)   Collection Time: 04/08/18  7:30 PM  Result Value Ref Range Status   Specimen Description BLOOD RIGHT ANTECUBITAL  Final   Special Requests   Final    BOTTLES DRAWN AEROBIC AND ANAEROBIC  Blood Culture results may not be optimal due to an excessive volume of blood received in culture bottles   Culture   Final    NO GROWTH 4 DAYS Performed at [facility] [address], [place], [place] [address]    Report Status PENDING  Incomplete  Culture, blood (Routine x 2)     Status: None (Preliminary result)   Collection Time: 04/08/18  7:55 PM  Result Value Ref Range Status   Specimen Description BLOOD LEFT ANTECUBITAL  Final   Special Requests   Final    AEROBIC BOTTLE ONLY Blood Culture results may not be optimal due to an excessive volume of blood received in culture bottles   Culture   Final    NO GROWTH 4 DAYS Performed at [facility], [place] [address], [place], [place] [address]    Report Status PENDING  Incomplete  MRSA PCR Screening     Status: None   Collection Time: 04/09/18  1:02 AM  Result Value Ref Range Status   MRSA by PCR NEGATIVE NEGATIVE Final    Comment:        The GeneXpert MRSA Assay (FDA approved for NASAL specimens only), is one component of a comprehensive MRSA colonization surveillance program. It is not intended to diagnose MRSA infection nor to guide or monitor treatment for MRSA infections. Performed at [facility], [place] [address], [place], [place] [address]      Radiology Studies: Dg Chest 2 View  Result Date: 04/11/2018 CLINICAL DATA:  Congestive heart failure.  Follow-up. EXAM: CHEST - 2 VIEW COMPARISON:  04/08/2018 FINDINGS: Cardiomegaly as seen previously. Aortic atherosclerosis. Lungs remain clear. The vascularity is normal. Tiny effusions in the posterior costophrenic angles. No significant bone finding. IMPRESSION: Cardiomegaly and aortic atherosclerosis. Tiny effusions in the posterior costophrenic angles. No edema. Electronically Signed   By: Nelson Chimes M.D.   On: 04/11/2018 13:51    Scheduled Meds: . amLODipine  5 mg Oral Daily  . apixaban  5 mg Oral BID  . calcium-vitamin D  1 tablet Oral QHS  .  cholecalciferol  2,000 Units Oral QHS  . diltiazem  90 mg Oral Q6H  . estradiol  0.5 mg Oral Q M,W,F  . folic acid  086 mcg Oral Daily  . furosemide  20 mg Intravenous Q12H  . levothyroxine  75 mcg Oral QAC breakfast  . metoprolol succinate  150 mg Oral Daily  . predniSONE  40 mg Oral Q breakfast  . simvastatin  10 mg Oral QHS  . vitamin C  1,000 mg Oral QHS   Continuous Infusions: . diltiazem (CARDIZEM) infusion Stopped (04/12/18 1206)     LOS: 3 days   Marylu Lund, MD [facility] Pager [phone]  If 7PM-7AM, please contact night-coverage [email or web address] Password [email or web address] 04/13/2018, 10:04 AM

## 2018-04-14 DIAGNOSIS — I5043 Acute on chronic combined systolic (congestive) and diastolic (congestive) heart failure: Secondary | ICD-10-CM

## 2018-04-14 LAB — BASIC METABOLIC PANEL
Anion gap: 11 (ref 5–15)
BUN: 14 mg/dL (ref 6–20)
CALCIUM: 8.9 mg/dL (ref 8.9–10.3)
CHLORIDE: 93 mmol/L — AB (ref 101–111)
CO2: 31 mmol/L (ref 22–32)
CREATININE: 0.79 mg/dL (ref 0.44–1.00)
GFR calc non Af Amer: >60 mL/min (ref >60)
Glucose, Bld: 111 mg/dL — ABNORMAL HIGH (ref 65–99)
Potassium: 3.6 mmol/L (ref 3.5–5.1)
SODIUM: 135 mmol/L (ref 135–145)

## 2018-04-14 LAB — MAGNESIUM: Magnesium: 1.6 mg/dL — ABNORMAL LOW (ref 1.7–2.4)

## 2018-04-14 MED ORDER — SODIUM CHLORIDE 0.9 % IV SOLN
250.0000 mL | INTRAVENOUS | Status: DC
  Administered 2018-04-15: 250 mL via INTRAVENOUS

## 2018-04-14 MED ORDER — SODIUM CHLORIDE 0.9% FLUSH
3.0000 mL | Freq: Two times a day (BID) | INTRAVENOUS | Status: DC
  Administered 2018-04-14: 3 mL via INTRAVENOUS

## 2018-04-14 MED ORDER — SODIUM CHLORIDE 0.9% FLUSH: 3.0000 mL | INTRAVENOUS | Status: DC | PRN

## 2018-04-14 NOTE — H&P (View-Only) (Signed)
Cardiology Consultation:   Patient ID: Cynthia Combs; [id number]; 05/07/1941   Admit date: 04/08/2018 Date of Consult: 04/14/2018  Primary Care Provider: Crist Infante, MD Primary Cardiologist: new to [facility]    Patient Profile:   Cynthia Combs is a 77 y.o. female with a hx of persistent atrial fibrillation on Eliquis, hypertension, hyperlipidemia and hypothyroidism who is being seen today for the evaluation of atrial fibrillation with RVR at the request of Dr. Wyline Copas.  She followed in atrial fibrillation clinic.  She initially found in A. fib by PCP and referred to A. fib clinic.  Echo with normal LV function.  Anticoagulated with Eliquis.  Offered cardioversion however declined stating that her father and aunt both has failed cardioversion.  Commended follow-up with general cardiology however she declined as well.  She was asymptomatic with A. fib.  History of Present Illness:   Ms. Atha admitted 6/1 due to sepsis secondary to strep pharyngitis and A. fib RVR.  Patient initially diagnosed with strep throat 2 days ago and treated with antibiotic however her symptoms persisted.  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  Due to ongoing sore throat, PCP sent her to ER for further evaluation.  She was found leukocytosis and elevated lactic acid.  She was admitted for sepsis and treatment and plan.  She remained afebrile.  Treated with antibiotic.  Patient initially treated with Cardizem gtt now on p.o. short-acting Cardizem.  Rate fluctuates between 90s to 130s with minimal exertion.  Cardiology is asked for further treatment and plan.  She denies orthopnea, PND, syncope, dizziness or melena.  She has lower extremity edema since admitted. Home lasix on and HCTZ on hold. Continues to have mild cough.  Echocardiogram this admission showed LV function of 55 to 60%, no wall motion abnormality  no significant valvular abnormality noted.  She is feeling well this am .   HR is well controlled.    Past Medical History:  Diagnosis Date  . Anemia   . Atrial fibrillation ([place])   . Cyst    HX of   . Hemorrhoids   . Hyperlipemia   . Hypertension   . Hyperthyroidism   . Thyroid nodule     Past Surgical History:  Procedure Laterality Date  . ABDOMINAL EXPLORATION SURGERY    . ABDOMINAL HYSTERECTOMY    . APPENDECTOMY    . CHOLECYSTECTOMY    . COLON SURGERY     Due to large cyst/tumor--Benign  . DILATION AND CURETTAGE OF UTERUS    . INCISE AND DRAIN ABCESS     left hand  . KNEE SURGERY     Right      Inpatient Medications: Scheduled Meds: . amLODipine  5 mg Oral Daily  . apixaban  5 mg Oral BID  . calcium-vitamin D  1 tablet Oral QHS  . cholecalciferol  2,000 Units Oral QHS  . diltiazem  90 mg Oral Q6H  . estradiol  0.5 mg Oral Q M,W,F  . folic acid  782 mcg Oral Daily  . furosemide  20 mg Intravenous Q12H  . levothyroxine  75 mcg Oral QAC breakfast  . metoprolol succinate  150 mg Oral Daily  . predniSONE  40 mg Oral Q breakfast  . simvastatin  10 mg Oral QHS  . vitamin C  1,000 mg Oral QHS   Continuous Infusions: . diltiazem (CARDIZEM) infusion Stopped (04/12/18 1206)   PRN  Meds: acetaminophen, hydrALAZINE, labetalol, menthol-cetylpyridinium, ondansetron **OR** ondansetron (ZOFRAN) IV, oxyCODONE-acetaminophen, phenol, polyethylene glycol, zolpidem  Allergies:    Allergies  Allergen Reactions  . Crestor [Rosuvastatin Calcium] Other (See Comments)    Muscle weakness  . Lisinopril Other (See Comments)    Unknown reaction - reported by [facility]  . Niacin And Related Other (See Comments)    Facial redness  . Other Other (See Comments)    Passed out after swine flu shot, couldn't move for several days  . Prevnar 13 [Pneumococcal 13-Val Conj Vacc] Other (See Comments)    Arm tingled for months after administration  . Singulair  [Montelukast Sodium] Other (See Comments)    Blood pressure increased    Social History:   Social History   Socioeconomic History  . Marital status: Divorced    Spouse name: Not on file  . Number of children: 2  . Years of education: Not on file  . Highest education level: Not on file  Occupational History  . Occupation: Therapist, sports     Comment: Retired   Scientific laboratory technician  . Financial resource strain: Not on file  . Food insecurity:    Worry: Not on file    Inability: Not on file  . Transportation needs:    Medical: Not on file    Non-medical: Not on file  Tobacco Use  . Smoking status: Former Research scientist (life sciences)  . Smokeless tobacco: Never Used  Substance and Sexual Activity  . Alcohol use: No  . Drug use: No  . Sexual activity: Not on file  Lifestyle  . Physical activity:    Days per week: Not on file    Minutes per session: Not on file  . Stress: Not on file  Relationships  . Social connections:    Talks on phone: Not on file    Gets together: Not on file    Attends religious service: Not on file    Active member of club or organization: Not on file    Attends meetings of clubs or organizations: Not on file    Relationship status: Not on file  . Intimate partner violence:    Fear of current or ex partner: Not on file    Emotionally abused: Not on file    Physically abused: Not on file    Forced sexual activity: Not on file  Other Topics Concern  . Not on file  Social History Narrative  . Not on file    Family History:   Family History  Problem Relation Age of Onset  . Colon polyps Mother   . Heart disease Mother   . Colon polyps Sister   . Breast cancer Maternal Aunt   . Kidney disease Father   . Colon cancer Neg Hx      ROS:  Please see the history of present illness.  All other ROS reviewed and negative.     Physical Exam/Data:   Vitals:   04/13/18 2300 04/14/18 0300 04/14/18 0500 04/14/18 0722  BP: 128/89 135/73  (!) 117/49  Pulse: 92 100  91  Resp: 14 13  13     Temp: (!) 97.4 F (36.3 C) 98.2 F (36.8 C)  97.9 F (36.6 C)  TempSrc: Oral Oral  Oral  SpO2: 96% 98%  97%  Weight:   202 lb 1.6 oz (91.7 kg)   Height:        Intake/Output Summary (Last 24 hours) at 04/14/2018 0831 Last data filed at 04/14/2018 0500 Gross per 24  hour  Intake 240 ml  Output 2950 ml  Net -2710 ml   Filed Weights   04/12/18 0500 04/13/18 0324 04/14/18 0500  Weight: 215 lb 3.2 oz (97.6 kg) 212 lb 6.4 oz (96.3 kg) 202 lb 1.6 oz (91.7 kg)    Physical Exam: Blood pressure (!) 117/49, pulse 91, temperature 97.9 F (36.6 C), temperature source Oral, resp. rate 13, height 6\' 1"  (1.854 m), weight 202 lb 1.6 oz (91.7 kg), SpO2 97 %.  GEN:  Well nourished, well developed in no acute distress HEENT: Normal NECK: No JVD; No carotid bruits LYMPHATICS: No lymphadenopathy CARDIAC:  Irreg. Irreg.  RESPIRATORY:  Clear to auscultation without rales, wheezing or rhonchi  ABDOMEN: Soft, non-tender, non-distended MUSCULOSKELETAL:  No edema; No deformity  SKIN: Warm and dry NEUROLOGIC:  Alert and oriented x 3   EKG:  The EKG was personally reviewed and demonstrates:  afib at rate of 156 bpm Telemetry:  Telemetry was personally reviewed and demonstrates:  AFib at rate of 90-130s  Relevant CV Studies:  Echo 04/09/2018 Study Conclusions  - Left ventricle: The cavity size was normal. Wall thickness was   increased in a pattern of mild LVH. Systolic function was normal.   The estimated ejection fraction was in the range of 55% to 60%.   Wall motion was normal; there were no regional wall motion   abnormalities. The study was not technically sufficient to allow   evaluation of LV diastolic dysfunction due to atrial   fibrillation. - Aortic valve: Mildly calcified annulus. Trileaflet. - Mitral valve: There was mild regurgitation. - Right ventricle: Systolic function was reduced. - Right atrium: The atrium was mildly dilated. - Atrial septum: No defect or patent foramen ovale  was identified. - Tricuspid valve: There was moderate regurgitation. - Pulmonic valve: There was mild regurgitation. - Pulmonary arteries: PA peak pressure: 55 mm Hg (S). - Systemic veins: Dilated IVC with normal respiratory variation.   Estimated CVP 8 mmHg. - Pericardium, extracardiac: A small pericardial effusion was   identified. Features were not consistent with tamponade   physiology.  Laboratory Data:  Chemistry Recent Labs  Lab 04/12/18 0224 04/13/18 0243 04/14/18 0205  NA 133* 133* 135  K 4.1 3.8 3.6  CL 93* 95* 93*  CO2 30 29 31   GLUCOSE 110* 126* 111*  BUN 12 12 14   CREATININE 0.87 0.69 0.79  CALCIUM 8.7* 8.8* 8.9  GFRNONAA >60 >60 >60  GFRAA >60 >60 >60  ANIONGAP 10 9 11     Recent Labs  Lab 04/08/18 1954  PROT 6.8  ALBUMIN 3.1*  AST 24  ALT 24  ALKPHOS 155*  BILITOT 0.7   Hematology Recent Labs  Lab 04/11/18 0219 04/12/18 0224 04/13/18 0243  WBC 11.1* 14.6* 13.0*  RBC 3.54* 4.03 3.75*  HGB 9.3* 10.5* 9.9*  HCT 30.3* 34.8* 31.9*  MCV 85.6 86.4 85.1  MCH 26.3 26.1 26.4  MCHC 30.7 30.2 31.0  RDW 15.9* 15.9* 15.7*  PLT 455* 520* 481*   Cardiac Enzymes Recent Labs  Lab 04/08/18 2336 04/09/18 0303 04/09/18 1014  TROPONINI <0.03 <0.03 <0.03   BNP Recent Labs  Lab 04/08/18 2336  BNP 154.7*   Radiology/Studies:  Dg Chest 2 View  Result Date: 04/11/2018 CLINICAL DATA:  Congestive heart failure.  Follow-up. EXAM: CHEST - 2 VIEW COMPARISON:  04/08/2018 FINDINGS: Cardiomegaly as seen previously. Aortic atherosclerosis. Lungs remain clear. The vascularity is normal. Tiny effusions in the posterior costophrenic angles. No significant bone finding. IMPRESSION: Cardiomegaly and  aortic atherosclerosis. Tiny effusions in the posterior costophrenic angles. No edema. Electronically Signed   By: Nelson Chimes M.D.   On: 04/11/2018 13:51   Assessment and Plan:   1. Persistent atrial fibrillation -Fluctuating rate between 90-130s.  Initially treated  with IV Cardizem now on p.o.  She was taking metoprolol succinate 150 mg daily.  Previously declined cardioversion stating her father and aunt both has failed cardioversion.  She has not missed any single dose of Eliquis. -Currently on Toprol-XL 150 mg and Cardizem 90 mg every 6 hours.    She has completed her course of antibiotics for her strep pharyngitis.  She does not appear to be toxic.  I think that we could try doing a cardioversion tomorrow.  We will see if we can set that up. Had long discussion about cardioversion.  She is anxious about the procedure but does agree to proceed.  2.  Lower extremity edema Is diuresed 5.4 liters so far this admission .  Her leg edema is basically gone.  3.  Hypertension Blood pressure readings have been stable.  4.  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  5. Chronic anemia Plans per internal medicine team.     Mertie Moores, MD  04/14/2018 8:49 AM    [facility],  [place], [place]  [address] Pager [phone] Phone: [phone]; Fax: [phone]

## 2018-04-14 NOTE — Progress Notes (Signed)
PROGRESS NOTE    Shirl Weir  RCB:[id number] DOB: 1941-05-09 DOA: 04/08/2018 PCP: Crist Infante, MD    Brief Narrative:  77 y.o. female with medical history significant of hypertension, hyperlipidemia, hypothyroidism, anemia, atrial fibrillation on Eliquis, who presents with sore throat and patient states that rash.  Pt states that because of sore throat, she was diagnosed as strep throat 2 weeks ago by PCP.  She was given 1 intramuscular Rocephin injection, then started with Cefdinir. She states that she does not have significant improvement.  She continues to have throat pain, which is sharp, scratching pain, 4 out of 10 severity, nonradiating.  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 Her doctor is concerned that patient may have sepsis, therefore sent her to emergency room for further evaluation and treatment.  Patient states that she does not have fever or chills.  She has mild dry cough, but no shortness of breath or chest pain.  She is mildly constipated, no nausea, vomiting, diarrhea, abdominal pain, symptoms of UTI.  Patient states that 2 days ago she developed nodular rash in both arms, which is pink-colored, not itchy, but mildly painful.  ED Course: pt was found to have WBC 13.8, lactic acid 2.71, electrolytes renal function okay, temperature normal, A. fib with RVR, EKG, oxygen saturation 96% on room air, chest x-ray negative.  Patient is placed on stepdown bed for observation.  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  Now  developed sepsis with leukocytosis, tachycardia and tachypnea.  Lactic acid elevated 2.71. Remains stable at this time -completed course of IV Rocephin -PRN Cepacol as needd -remains afebrile  Atrial fibrillation with RVR: This is most likely triggered by sepsis and ongoing infection. CHA2DS2-VASc Score is 4, needs oral anticoagulation. Patient is onEliquis at home. Required cardizem gtt with cardizem, recently transitioned to PO cardizem -Continue Eliquis -Continue home metoprolol -Patient continued on 90mg  PO cardizem q6hrs -Continued on PRN labetalol for HR>120 -HR remains labile in the mid-100's, asymptomatic -Appreciate assistance by cardiology.  Plans for cardioversion tomorrow.  Anemia: Hemoglobin stable at this time -We will recheck CBC in the morning  HLD: -Continue Zocor as she tolerates  HTN:  -Continue home medications: Metoprolol, irbesartan -hold HCTZ and lasix due to sepsis -continue with IV hydralazine as patient tolerates  Hypothyroidism: Last TSH was 1.650 on 03/30/17 -Continue with home Synthroid as patient tolerates  Possible meningioma ([place]): This is incidental finding. -Remains neurologically stable -Consider follow up with Neurosurgery as outpatient  Rash: Unclear etiology, not sure but diagnosis, possible erythema nodosum? -Recommend follow-up with dermatologist on discharge   DVT prophylaxis: Eliquis Code Status: Full Family Communication: Pt in room, family not at bedside Disposition Plan: Uncertain at this time  Consultants:   Cardiology  Procedures:     Antimicrobials: Anti-infectives (From admission, onward)   Start     Dose/Rate Route Frequency Ordered Stop   04/09/18 2200  cefTRIAXone (ROCEPHIN) 1 g in sodium chloride 0.9 % 100 mL IVPB  Status:  Discontinued     1  g 200 mL/hr over 30 Minutes Intravenous  Once 04/08/18 2239 04/09/18 1323   04/09/18 1400  cefTRIAXone (ROCEPHIN) 1 g in sodium chloride 0.9 % 100 mL IVPB  Status:   Discontinued     1 g 200 mL/hr over 30 Minutes Intravenous Every 12 hours 04/09/18 1323 04/12/18 1044   04/08/18 2130  cefTRIAXone (ROCEPHIN) 1 g in sodium chloride 0.9 % 100 mL IVPB     1 g 200 mL/hr over 30 Minutes Intravenous  Once 04/08/18 2124 04/08/18 2322      Subjective: Denies palpitations, chest pain or shortness of breath  Objective: Vitals:   04/14/18 0722 04/14/18 0937 04/14/18 1151 04/14/18 1550  BP: (!) 117/49  (!) 130/92 128/71  Pulse: 91 (!) 145 80 90  Resp: 13  14 15   Temp: 97.9 F (36.6 C)  97.9 F (36.6 C) 97.7 F (36.5 C)  TempSrc: Oral  Oral Oral  SpO2: 97%  98% 97%  Weight:      Height:        Intake/Output Summary (Last 24 hours) at 04/14/2018 1557 Last data filed at 04/14/2018 1545 Gross per 24 hour  Intake 480 ml  Output 2660 ml  Net -2180 ml   Filed Weights   04/12/18 0500 04/13/18 0324 04/14/18 0500  Weight: 97.6 kg (215 lb 3.2 oz) 96.3 kg (212 lb 6.4 oz) 91.7 kg (202 lb 1.6 oz)    Examination: General exam: Awake, laying in bed, in nad Respiratory system: Normal respiratory effort, no wheezing Cardiovascular system: Irregularly irregular, S1, S2, tachycardic Gastrointestinal system: Soft, nondistended, positive BS Central nervous system: CN2-12 grossly intact, strength intact Extremities: Perfused, no clubbing Skin: Normal skin turgor, no notable skin lesions seen Psychiatry: Mood normal // no visual hallucinations   Data Reviewed: I have personally reviewed following labs and imaging studies  CBC: Recent Labs  Lab 04/08/18 1954 04/09/18 0303 04/10/18 0742 04/11/18 0219 04/12/18 0224 04/13/18 0243  WBC 13.8* 10.5 14.0* 11.1* 14.6* 13.0*  NEUTROABS 12.1*  --   --   --   --   --   HGB 10.8* 9.7* 9.9* 9.3* 10.5* 9.9*  HCT 34.8* 31.1* 32.2* 30.3* 34.8* 31.9*  MCV 85.1 85.2 85.4 85.6 86.4 85.1  PLT 570* 473* 458* 455* 520* 505*   Basic Metabolic Panel: Recent Labs  Lab 04/09/18 1014 04/10/18 0742 04/11/18 0219  04/12/18 0224 04/13/18 0243 04/14/18 0205  NA  --  129* 127* 133* 133* 135  K  --  4.3 4.1 4.1 3.8 3.6  CL  --  95* 92* 93* 95* 93*  CO2  --  26 26 30 29 31   GLUCOSE  --  117* 108* 110* 126* 111*  BUN  --  11 10 12 12 14   CREATININE  --  0.69 0.69 0.87 0.69 0.79  CALCIUM  --  8.7* 8.5* 8.7* 8.8* 8.9  MG 1.4*  --  1.7  --   --  1.6*   GFR: Estimated Creatinine Clearance: 77.4 mL/min (by C-G formula based on SCr of 0.79 mg/dL). Liver Function Tests: Recent Labs  Lab 04/08/18 1954  AST 24  ALT 24  ALKPHOS 155*  BILITOT 0.7  PROT 6.8  ALBUMIN 3.1*   No results for input(s): LIPASE, AMYLASE in the last 168 hours. No results for input(s): AMMONIA in the last 168 hours. Coagulation Profile: Recent Labs  Lab 04/08/18 1954  INR 1.53   Cardiac Enzymes: Recent Labs  Lab 04/08/18 2336 04/09/18 0303 04/09/18 1014  TROPONINI <  0.03 <0.03 <0.03   BNP (last 3 results) No results for input(s): PROBNP in the last 8760 hours. HbA1C: No results for input(s): HGBA1C in the last 72 hours. CBG: No results for input(s): GLUCAP in the last 168 hours. Lipid Profile: No results for input(s): CHOL, HDL, LDLCALC, TRIG, CHOLHDL, LDLDIRECT in the last 72 hours. Thyroid Function Tests: No results for input(s): TSH, T4TOTAL, FREET4, T3FREE, THYROIDAB in the last 72 hours. Anemia Panel: No results for input(s): VITAMINB12, FOLATE, FERRITIN, TIBC, IRON, RETICCTPCT in the last 72 hours. Sepsis Labs: Recent Labs  Lab 04/08/18 2037 04/08/18 2151 04/08/18 2336 04/09/18 0303  PROCALCITON  --   --  <0.10  --   LATICACIDVEN 2.71* 0.86 1.4 1.3    Recent Results (from the past 240 hour(s))  Culture, blood (Routine x 2)     Status: None   Collection Time: 04/08/18  7:30 PM  Result Value Ref Range Status   Specimen Description BLOOD RIGHT ANTECUBITAL  Final   Special Requests   Final    BOTTLES DRAWN AEROBIC AND ANAEROBIC Blood Culture results may not be optimal due to an excessive volume of  blood received in culture bottles   Culture   Final    NO GROWTH 5 DAYS Performed at [facility] [address], [place], [place] [address]    Report Status 04/13/2018 FINAL  Final  Culture, blood (Routine x 2)     Status: None   Collection Time: 04/08/18  7:55 PM  Result Value Ref Range Status   Specimen Description BLOOD LEFT ANTECUBITAL  Final   Special Requests   Final    AEROBIC BOTTLE ONLY Blood Culture results may not be optimal due to an excessive volume of blood received in culture bottles   Culture   Final    NO GROWTH 5 DAYS Performed at [facility], [place] [address] [place], [place] [address]    Report Status 04/13/2018 FINAL  Final  MRSA PCR Screening     Status: None   Collection Time: 04/09/18  1:02 AM  Result Value Ref Range Status   MRSA by PCR NEGATIVE NEGATIVE Final    Comment:        The GeneXpert MRSA Assay (FDA approved for NASAL specimens only), is one component of a comprehensive MRSA colonization surveillance program. It is not intended to diagnose MRSA infection nor to guide or monitor treatment for MRSA infections. Performed at [facility], [place] [address], [place], [place] [address]      Radiology Studies: No results found.  Scheduled Meds: . amLODipine  5 mg Oral Daily  . apixaban  5 mg Oral BID  . calcium-vitamin D  1 tablet Oral QHS  . cholecalciferol  2,000 Units Oral QHS  . diltiazem  90 mg Oral Q6H  . estradiol  0.5 mg Oral Q M,W,F  . folic acid  756 mcg Oral Daily  . furosemide  20 mg Intravenous Q12H  . levothyroxine  75 mcg Oral QAC breakfast  . metoprolol succinate  150 mg Oral Daily  . predniSONE  40 mg Oral Q breakfast  . simvastatin  10 mg Oral QHS  . sodium chloride flush  3 mL Intravenous Q12H  . vitamin C  1,000 mg Oral QHS   Continuous Infusions: . sodium chloride       LOS: 4 days   Marylu Lund, MD [facility] Pager [phone]  If 7PM-7AM, please contact  night-coverage [email or web address] Password [email or web address]  04/14/2018, 3:57 PM

## 2018-04-14 NOTE — Consult Note (Signed)
Cardiology Consultation:   Patient ID: Cynthia Combs; [id number]; [birth year]   Admit date: 04/08/2018 Date of Consult: 04/14/2018  Primary Care Provider: Crist Infante, MD Primary Cardiologist: new to [facility]    Patient Profile:   Cynthia Combs is a 77 y.o. female with a hx of persistent atrial fibrillation on Eliquis, hypertension, hyperlipidemia and hypothyroidism who is being seen today for the evaluation of atrial fibrillation with RVR at the request of Dr. Wyline Copas.  She followed in atrial fibrillation clinic.  She initially found in A. fib by PCP and referred to A. fib clinic.  Echo with normal LV function.  Anticoagulated with Eliquis.  Offered cardioversion however declined stating that her father and aunt both has failed cardioversion.  Commended follow-up with general cardiology however she declined as well.  She was asymptomatic with A. fib.  History of Present Illness:   [name] admitted 6/1 due to sepsis secondary to strep pharyngitis and A. fib RVR.  Patient initially diagnosed with strep throat 2 days ago and treated with antibiotic however her symptoms persisted.  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  Due to ongoing sore throat, PCP sent her to ER for further evaluation.  She was found leukocytosis and elevated lactic acid.  She was admitted for sepsis and treatment and plan.  She remained afebrile.  Treated with antibiotic.  Patient initially treated with Cardizem gtt now on p.o. short-acting Cardizem.  Rate fluctuates between 90s to 130s with minimal exertion.  Cardiology is asked for further treatment and plan.  She denies orthopnea, PND, syncope, dizziness or melena.  She has lower extremity edema since admitted. Home lasix on and HCTZ on hold. Continues to have mild cough.  Echocardiogram this admission showed LV function of 55 to 60%, no wall motion abnormality  no significant valvular abnormality noted.  She is feeling well this am .   HR is well controlled.    Past Medical History:  Diagnosis Date  . Anemia   . Atrial fibrillation ([place])   . Cyst    HX of   . Hemorrhoids   . Hyperlipemia   . Hypertension   . Hyperthyroidism   . Thyroid nodule     Past Surgical History:  Procedure Laterality Date  . ABDOMINAL EXPLORATION SURGERY    . ABDOMINAL HYSTERECTOMY    . APPENDECTOMY    . CHOLECYSTECTOMY    . COLON SURGERY     Due to large cyst/tumor--Benign  . DILATION AND CURETTAGE OF UTERUS    . INCISE AND DRAIN ABCESS     left hand  . KNEE SURGERY     Right      Inpatient Medications: Scheduled Meds: . amLODipine  5 mg Oral Daily  . apixaban  5 mg Oral BID  . calcium-vitamin D  1 tablet Oral QHS  . cholecalciferol  2,000 Units Oral QHS  . diltiazem  90 mg Oral Q6H  . estradiol  0.5 mg Oral Q M,W,F  . folic acid  536 mcg Oral Daily  . furosemide  20 mg Intravenous Q12H  . levothyroxine  75 mcg Oral QAC breakfast  . metoprolol succinate  150 mg Oral Daily  . predniSONE  40 mg Oral Q breakfast  . simvastatin  10 mg Oral QHS  . vitamin C  1,000 mg Oral QHS   Continuous Infusions: . diltiazem (CARDIZEM) infusion Stopped (04/12/18 1206)   PRN  Meds: acetaminophen, hydrALAZINE, labetalol, menthol-cetylpyridinium, ondansetron **OR** ondansetron (ZOFRAN) IV, oxyCODONE-acetaminophen, phenol, polyethylene glycol, zolpidem  Allergies:    Allergies  Allergen Reactions  . Crestor [Rosuvastatin Calcium] Other (See Comments)    Muscle weakness  . Lisinopril Other (See Comments)    Unknown reaction - reported by [facility]  . Niacin And Related Other (See Comments)    Facial redness  . Other Other (See Comments)    Passed out after swine flu shot, couldn't move for several days  . Prevnar 13 [Pneumococcal 13-Val Conj Vacc] Other (See Comments)    Arm tingled for months after administration  . Singulair  [Montelukast Sodium] Other (See Comments)    Blood pressure increased    Social History:   Social History   Socioeconomic History  . Marital status: Divorced    Spouse name: Not on file  . Number of children: 2  . Years of education: Not on file  . Highest education level: Not on file  Occupational History  . Occupation: Therapist, sports     Comment: Retired   Scientific laboratory technician  . Financial resource strain: Not on file  . Food insecurity:    Worry: Not on file    Inability: Not on file  . Transportation needs:    Medical: Not on file    Non-medical: Not on file  Tobacco Use  . Smoking status: Former Research scientist (life sciences)  . Smokeless tobacco: Never Used  Substance and Sexual Activity  . Alcohol use: No  . Drug use: No  . Sexual activity: Not on file  Lifestyle  . Physical activity:    Days per week: Not on file    Minutes per session: Not on file  . Stress: Not on file  Relationships  . Social connections:    Talks on phone: Not on file    Gets together: Not on file    Attends religious service: Not on file    Active member of club or organization: Not on file    Attends meetings of clubs or organizations: Not on file    Relationship status: Not on file  . Intimate partner violence:    Fear of current or ex partner: Not on file    Emotionally abused: Not on file    Physically abused: Not on file    Forced sexual activity: Not on file  Other Topics Concern  . Not on file  Social History Narrative  . Not on file    Family History:   Family History  Problem Relation Age of Onset  . Colon polyps Mother   . Heart disease Mother   . Colon polyps Sister   . Breast cancer Maternal Aunt   . Kidney disease Father   . Colon cancer Neg Hx      ROS:  Please see the history of present illness.  All other ROS reviewed and negative.     Physical Exam/Data:   Vitals:   04/13/18 2300 04/14/18 0300 04/14/18 0500 04/14/18 0722  BP: 128/89 135/73  (!) 117/49  Pulse: 92 100  91  Resp: 14 13  13     Temp: (!) 97.4 F (36.3 C) 98.2 F (36.8 C)  97.9 F (36.6 C)  TempSrc: Oral Oral  Oral  SpO2: 96% 98%  97%  Weight:   202 lb 1.6 oz (91.7 kg)   Height:        Intake/Output Summary (Last 24 hours) at 04/14/2018 0831 Last data filed at 04/14/2018 0500 Gross per 24  hour  Intake 240 ml  Output 2950 ml  Net -2710 ml   Filed Weights   04/12/18 0500 04/13/18 0324 04/14/18 0500  Weight: 215 lb 3.2 oz (97.6 kg) 212 lb 6.4 oz (96.3 kg) 202 lb 1.6 oz (91.7 kg)    Physical Exam: Blood pressure (!) 117/49, pulse 91, temperature 97.9 F (36.6 C), temperature source Oral, resp. rate 13, height 6\' 1"  (1.854 m), weight 202 lb 1.6 oz (91.7 kg), SpO2 97 %.  GEN:  Well nourished, well developed in no acute distress HEENT: Normal NECK: No JVD; No carotid bruits LYMPHATICS: No lymphadenopathy CARDIAC:  Irreg. Irreg.  RESPIRATORY:  Clear to auscultation without rales, wheezing or rhonchi  ABDOMEN: Soft, non-tender, non-distended MUSCULOSKELETAL:  No edema; No deformity  SKIN: Warm and dry NEUROLOGIC:  Alert and oriented x 3   EKG:  The EKG was personally reviewed and demonstrates:  afib at rate of 156 bpm Telemetry:  Telemetry was personally reviewed and demonstrates:  AFib at rate of 90-130s  Relevant CV Studies:  Echo 04/09/2018 Study Conclusions  - Left ventricle: The cavity size was normal. Wall thickness was   increased in a pattern of mild LVH. Systolic function was normal.   The estimated ejection fraction was in the range of 55% to 60%.   Wall motion was normal; there were no regional wall motion   abnormalities. The study was not technically sufficient to allow   evaluation of LV diastolic dysfunction due to atrial   fibrillation. - Aortic valve: Mildly calcified annulus. Trileaflet. - Mitral valve: There was mild regurgitation. - Right ventricle: Systolic function was reduced. - Right atrium: The atrium was mildly dilated. - Atrial septum: No defect or patent foramen ovale  was identified. - Tricuspid valve: There was moderate regurgitation. - Pulmonic valve: There was mild regurgitation. - Pulmonary arteries: PA peak pressure: 55 mm Hg (S). - Systemic veins: Dilated IVC with normal respiratory variation.   Estimated CVP 8 mmHg. - Pericardium, extracardiac: A small pericardial effusion was   identified. Features were not consistent with tamponade   physiology.  Laboratory Data:  Chemistry Recent Labs  Lab 04/12/18 0224 04/13/18 0243 04/14/18 0205  NA 133* 133* 135  K 4.1 3.8 3.6  CL 93* 95* 93*  CO2 30 29 31   GLUCOSE 110* 126* 111*  BUN 12 12 14   CREATININE 0.87 0.69 0.79  CALCIUM 8.7* 8.8* 8.9  GFRNONAA >60 >60 >60  GFRAA >60 >60 >60  ANIONGAP 10 9 11     Recent Labs  Lab 04/08/18 1954  PROT 6.8  ALBUMIN 3.1*  AST 24  ALT 24  ALKPHOS 155*  BILITOT 0.7   Hematology Recent Labs  Lab 04/11/18 0219 04/12/18 0224 04/13/18 0243  WBC 11.1* 14.6* 13.0*  RBC 3.54* 4.03 3.75*  HGB 9.3* 10.5* 9.9*  HCT 30.3* 34.8* 31.9*  MCV 85.6 86.4 85.1  MCH 26.3 26.1 26.4  MCHC 30.7 30.2 31.0  RDW 15.9* 15.9* 15.7*  PLT 455* 520* 481*   Cardiac Enzymes Recent Labs  Lab 04/08/18 2336 04/09/18 0303 04/09/18 1014  TROPONINI <0.03 <0.03 <0.03   BNP Recent Labs  Lab 04/08/18 2336  BNP 154.7*   Radiology/Studies:  Dg Chest 2 View  Result Date: 04/11/2018 CLINICAL DATA:  Congestive heart failure.  Follow-up. EXAM: CHEST - 2 VIEW COMPARISON:  04/08/2018 FINDINGS: Cardiomegaly as seen previously. Aortic atherosclerosis. Lungs remain clear. The vascularity is normal. Tiny effusions in the posterior costophrenic angles. No significant bone finding. IMPRESSION: Cardiomegaly and  aortic atherosclerosis. Tiny effusions in the posterior costophrenic angles. No edema. Electronically Signed   By: Nelson Chimes M.D.   On: 04/11/2018 13:51   Assessment and Plan:   1. Persistent atrial fibrillation -Fluctuating rate between 90-130s.  Initially treated  with IV Cardizem now on p.o.  She was taking metoprolol succinate 150 mg daily.  Previously declined cardioversion stating her father and aunt both has failed cardioversion.  She has not missed any single dose of Eliquis. -Currently on Toprol-XL 150 mg and Cardizem 90 mg every 6 hours.    She has completed her course of antibiotics for her strep pharyngitis.  She does not appear to be toxic.  I think that we could try doing a cardioversion tomorrow.  We will see if we can set that up. Had long discussion about cardioversion.  She is anxious about the procedure but does agree to proceed.  2.  Lower extremity edema Is diuresed 5.4 liters so far this admission .  Her leg edema is basically gone.  3.  Hypertension Blood pressure readings have been stable.  4.  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  5. Chronic anemia Plans per internal medicine team.     Mertie Moores, MD  04/14/2018 8:49 AM    [address],  [address], [place]  [id number] Pager [phone] Phone: [phone]; Fax: [phone]

## 2018-04-15 ENCOUNTER — Inpatient Hospital Stay (HOSPITAL_COMMUNITY): Payer: Medicare Other | Admitting: Certified Registered"

## 2018-04-15 ENCOUNTER — Encounter (HOSPITAL_COMMUNITY)
Admission: EM | Disposition: A | Discharge status: Home / Self Care | Payer: Self-pay | Source: Home / Self Care | Attending: Internal Medicine

## 2018-04-15 ENCOUNTER — Encounter (HOSPITAL_COMMUNITY): Payer: Self-pay | Admitting: *Deleted

## 2018-04-15 DIAGNOSIS — I4891 Unspecified atrial fibrillation: Secondary | ICD-10-CM

## 2018-04-15 HISTORY — PX: CARDIOVERSION: SHX1299

## 2018-04-15 LAB — PROTIME-INR
INR: 1.41
PROTHROMBIN TIME: 17.1 s — AB (ref 11.4–15.2)

## 2018-04-15 LAB — BASIC METABOLIC PANEL
ANION GAP: 8 (ref 5–15)
BUN: 14 mg/dL (ref 6–20)
CHLORIDE: 96 mmol/L — AB (ref 101–111)
CO2: 32 mmol/L (ref 22–32)
Calcium: 8.8 mg/dL — ABNORMAL LOW (ref 8.9–10.3)
Creatinine, Ser: 0.73 mg/dL (ref 0.44–1.00)
GFR calc Af Amer: >60 mL/min (ref >60)
GLUCOSE: 102 mg/dL — AB (ref 65–99)
POTASSIUM: 3.2 mmol/L — AB (ref 3.5–5.1)
SODIUM: 136 mmol/L (ref 135–145)

## 2018-04-15 LAB — APTT: APTT: 34 s (ref 24–36)

## 2018-04-15 LAB — CBC WITH DIFFERENTIAL/PLATELET
ABS IMMATURE GRANULOCYTES: 0.1 10*3/uL (ref 0.0–0.1)
BASOS ABS: 0 10*3/uL (ref 0.0–0.1)
Basophils Relative: 0 %
Eosinophils Absolute: 0 10*3/uL (ref 0.0–0.7)
Eosinophils Relative: 0 %
HCT: 31.8 % — ABNORMAL LOW (ref 36.0–46.0)
Hemoglobin: 10.1 g/dL — ABNORMAL LOW (ref 12.0–15.0)
Immature Granulocytes: 1 %
LYMPHS ABS: 1.3 10*3/uL (ref 0.7–4.0)
Lymphocytes Relative: 9 %
MCH: 26.8 pg (ref 26.0–34.0)
MCHC: 31.8 g/dL (ref 30.0–36.0)
MCV: 84.4 fL (ref 78.0–100.0)
Monocytes Absolute: 1 10*3/uL (ref 0.1–1.0)
Monocytes Relative: 7 %
NEUTROS ABS: 11.6 10*3/uL — AB (ref 1.7–7.7)
Neutrophils Relative %: 83 %
Platelets: 460 10*3/uL — ABNORMAL HIGH (ref 150–400)
RBC: 3.77 MIL/uL — AB (ref 3.87–5.11)
RDW: 15.8 % — ABNORMAL HIGH (ref 11.5–15.5)
WBC: 14 10*3/uL — AB (ref 4.0–10.5)

## 2018-04-15 SURGERY — CARDIOVERSION
Anesthesia: General

## 2018-04-15 MED ORDER — PROPOFOL 10 MG/ML IV BOLUS
INTRAVENOUS | Status: DC | PRN
  Administered 2018-04-15: 100 mg via INTRAVENOUS

## 2018-04-15 MED ORDER — LOSARTAN POTASSIUM 50 MG PO TABS
50.0000 mg | ORAL_TABLET | Freq: Every day | ORAL | Status: DC
  Administered 2018-04-15: 50 mg via ORAL
  Filled 2018-04-15: qty 1

## 2018-04-15 MED ORDER — LOSARTAN POTASSIUM 50 MG PO TABS: 50.0000 mg | ORAL_TABLET | Freq: Every day | ORAL | 0 refills | Status: DC

## 2018-04-15 MED ORDER — LIDOCAINE 2% (20 MG/ML) 5 ML SYRINGE
INTRAMUSCULAR | Status: DC | PRN
  Administered 2018-04-15: 80 mg via INTRAVENOUS

## 2018-04-15 MED ORDER — DILTIAZEM HCL ER COATED BEADS 120 MG PO CP24: 120.0000 mg | ORAL_CAPSULE | Freq: Every day | ORAL | 0 refills | Status: DC

## 2018-04-15 MED ORDER — DILTIAZEM HCL 60 MG PO TABS
90.0000 mg | ORAL_TABLET | Freq: Once | ORAL | Status: AC
  Administered 2018-04-15: 90 mg via ORAL
  Filled 2018-04-15: qty 2

## 2018-04-15 MED ORDER — DILTIAZEM HCL ER COATED BEADS 120 MG PO CP24
120.0000 mg | ORAL_CAPSULE | Freq: Every day | ORAL | Status: DC
  Filled 2018-04-15: qty 1

## 2018-04-15 MED ORDER — PREDNISONE 5 MG PO TABS: 5.0000 mg | ORAL_TABLET | Freq: Every day | ORAL | Status: DC

## 2018-04-15 MED ORDER — POTASSIUM CHLORIDE CRYS ER 20 MEQ PO TBCR
40.0000 meq | EXTENDED_RELEASE_TABLET | Freq: Two times a day (BID) | ORAL | Status: AC
  Administered 2018-04-15 (×2): 40 meq via ORAL
  Filled 2018-04-15 (×2): qty 2

## 2018-04-15 NOTE — Discharge Summary (Signed)
Physician Discharge Summary  Cynthia Combs JKK:[id number] DOB: 01-24-1941 DOA: 04/08/2018  PCP: Crist Infante, MD  Admit date: 04/08/2018 Discharge date: 04/15/2018  Admitted From: Home Disposition:  Home  Recommendations for Outpatient Follow-up:  1. Follow up with PCP in 1-2 weeks 2. Follow up with Cardiology as scheduled  Discharge Condition:Improved CODE STATUS:Full Diet recommendation: Heart healthy   Brief/Interim Summary: 77 y.o.femalewith medical history significant ofhypertension, hyperlipidemia, hypothyroidism, anemia, atrial fibrillation on Eliquis, who presents with sore throat and patient states that rash.  Pt states that because of sore throat, she was diagnosed as strep throat 2 weeks ago by PCP. She was given 1 intramuscular Rocephin injection, then started with Cefdinir. Shestates that she does not have significant improvement. She continues to have throat pain, which is sharp, scratchingpain,4 out of 10 severity, nonradiating. Her doctor did CT of neck soft tissue on 5/29, which showed no abscess,but showed a 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.Herdoctorisconcerned that patient may have sepsis, therefore sent her to emergency room for further evaluation and treatment. Patient states that she does not have fever or chills. She hasmild dry cough, but no shortness of breath or chest pain. She is mildly constipated, no nausea, vomiting, diarrhea, abdominal pain, symptoms of UTI. Patient states that 2 days ago she developed nodular rash in both arms, which ispink-colored, not itchy, butmildlypainful.  ED Course:pt was found to haveWBC 13.8, lactic acid 2.71, electrolytes renal function okay, temperature normal, A. fib with RVR, EKG, oxygen saturation 96% on room air, chest x-ray negative. Patient is placed on stepdown bed for observation.  Sepsis due to strep pharyngitis:Patient failed outpatient  antibiotic treatment. Now developed sepsis with leukocytosis, tachycardia and tachypnea. Lactic acid elevated 2.71. Remains stable at this time -completed course of IV Rocephin -PRN Cepacol as needd -remains afebrile  Atrial fibrillation with ZJI:RCVE is most likely triggered by sepsis and ongoing infection.CHA2DS2-VASc Scoreis 4, needs oral anticoagulation. Patient is onEliquis at home. Required cardizem gtt with cardizem, recently transitioned to PO cardizem -Continue Eliquis -Continue home metoprolol -Patient had been continued on 90mg  PO cardizem q6hrs -Patient remained tachycardic and ultimately underwent cardioversion on 6/7. Pt now in NSR -Cleared for d/c by Cardiology. Would have pt follow up closely with Cardiology as outpatient  Anemia:Hemoglobin stable at this time  HLD: -Continue Zocor as she tolerates  HTN:  -Continue home medications:Metoprolol -Patient to continue on Cozaar 50mg  on discharge  Hypothyroidism: Last TSH was1.650 on 03/30/17 -Continue with home Synthroid as patient tolerates  Possible meningioma ([place]):This is incidental finding. -Remains neurologically stable -Consider follow up with Neurosurgery as outpatient  Rash:Unclear etiology, not sure but diagnosis, possible erythema nodosum? 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[facility])   Rash   Erythema nodosum   Volume overload   Sore throat    Discharge Instructions   Allergies as of 04/15/2018      Reactions   Crestor [rosuvastatin Calcium] Other (See Comments)   Muscle weakness   Lisinopril Other (See Comments)   Unknown reaction - reported by [facility]   Niacin And Related Other (See Comments)   Facial redness   Other Other (See  Comments)   Passed out after swine flu shot, couldn't move for several days  Prevnar 13 [pneumococcal 13-val Conj Vacc] Other (See Comments)   Arm tingled for months after administration   Singulair [montelukast Sodium] Other (See Comments)   Blood pressure increased      Medication List    STOP taking these medications   cefdinir 300 MG capsule Commonly known as:  OMNICEF   valsartan-hydrochlorothiazide 320-12.5 MG tablet Commonly known as:  DIOVAN-HCT     TAKE these medications   acetaminophen 500 MG tablet Commonly known as:  TYLENOL Take 1,000 mg by mouth every 6 (six) hours as needed for headache (pain).   apixaban 5 MG Tabs tablet Commonly known as:  ELIQUIS Take 1 tablet (5 mg total) by mouth 2 (two) times daily.   CALCIUM 600-D PO Take 1,200 mg by mouth at bedtime.   cholecalciferol 1000 units tablet Commonly known as:  VITAMIN D Take 2,000 Units by mouth at bedtime.   diltiazem 120 MG 24 hr capsule Commonly known as:  CARDIZEM CD Take 1 capsule (120 mg total) by mouth daily.   estradiol 0.5 MG tablet Commonly known as:  ESTRACE Take 0.25 mg by mouth every Monday, Wednesday, and Friday.   folic acid 093 MCG tablet Commonly known as:  FOLVITE Take 400 mcg by mouth daily.   furosemide 20 MG tablet Commonly known as:  LASIX Take 20 mg by mouth daily.   HYDROcodone-acetaminophen 5-325 MG tablet Commonly known as:  NORCO/VICODIN Take 1-2 tablets by mouth every 6 (six) hours as needed (pain).   levothyroxine 150 MCG tablet Commonly known as:  SYNTHROID, LEVOTHROID Take 75 mcg by mouth daily before breakfast.   losartan 50 MG tablet Commonly known as:  COZAAR Take 1 tablet (50 mg total) by mouth daily.   metoprolol succinate 50 MG 24 hr tablet Commonly known as:  TOPROL-XL Take 50 mg by mouth See admin instructions. Take one tablet (50 mg) by mouth with a 100 mg tablet for a total dose of 150 mg every morning   metoprolol succinate 100 MG 24 hr  tablet Commonly known as:  TOPROL-XL Take 100 mg by mouth See admin instructions. Take one tablet (100 mg) by mouth with a 50 mg tablet for a total dose of 150 mg every morning   predniSONE 5 MG tablet Commonly known as:  DELTASONE Take 1 tablet (5 mg total) by mouth daily with breakfast.   simvastatin 20 MG tablet Commonly known as:  ZOCOR Take 20 mg by mouth at bedtime.   vitamin C 1000 MG tablet Take 1,000 mg by mouth at bedtime.      Follow-up Information    Crist Infante, MD. Schedule an appointment as soon as possible for a visit in 1 week(s).   Specialty:  Internal Medicine Contact information: [address] [place] [address] [phone]        Nahser, Wonda Cheng, MD Follow up.   Specialty:  Cardiology Why:  as scheduled Contact information: [address] [place] [address] [phone]          Allergies  Allergen Reactions  . Crestor [Rosuvastatin Calcium] Other (See Comments)    Muscle weakness  . Lisinopril Other (See Comments)    Unknown reaction - reported by [facility]  . Niacin And Related Other (See Comments)    Facial redness  . Other Other (See Comments)    Passed out after swine flu shot, couldn't move for several days  . Prevnar 13 [Pneumococcal 13-Val Conj Vacc] Other (See Comments)  Arm tingled for months after administration  . Singulair [Montelukast Sodium] Other (See Comments)    Blood pressure increased    Consultations:  Cardiology  Procedures/Studies: Dg Chest 2 View  Result Date: 04/11/2018 CLINICAL DATA:  Congestive heart failure.  Follow-up. EXAM: CHEST - 2 VIEW COMPARISON:  04/08/2018 FINDINGS: Cardiomegaly as seen previously. Aortic atherosclerosis. Lungs remain clear. The vascularity is normal. Tiny effusions in the posterior costophrenic angles. No significant bone finding. IMPRESSION: Cardiomegaly and aortic atherosclerosis. Tiny effusions in the posterior costophrenic angles. No  edema. Electronically Signed   By: Nelson Chimes M.D.   On: 04/11/2018 13:51   Ct Soft Tissue Neck W Contrast  Result Date: 04/06/2018 CLINICAL DATA:  Bilateral neck swelling.  Strep throat. EXAM: CT NECK WITH CONTRAST TECHNIQUE: Multidetector CT imaging of the neck was performed using the standard protocol following the bolus administration of intravenous contrast. CONTRAST:  29mL OMNIPAQUE IOHEXOL 300 MG/ML  SOLN COMPARISON:  Thyroid ultrasound 10/01/2009 FINDINGS: The study is mildly motion degraded. Pharynx and larynx: No pharyngeal mass or significant swelling identified within limitations of metallic dental streak artifact. No parapharyngeal or retropharyngeal fluid collection. Unremarkable larynx. Salivary glands: No inflammation, mass, or stone. Thyroid: Right larger than left thyroid nodules, previously evaluated by ultrasound as well as FNA of the dominant right lower pole nodule. Lymph nodes: No suspicious or grossly enlarged lymph nodes in the neck. A 7 mm short axis right supraclavicular lymph node is asymmetrically and large compared to the contralateral side and nonspecific though most likely reactive given patient's recent illness. Vascular: Major vascular structures of the neck are patent. Mild atherosclerosis is noted of the aortic arch and carotid arteries. Limited intracranial: 3.3 x 1.6 cm homogeneously enhancing extra-axial mass in the right cerebellopontine angle cistern extending across the porus acusticus and extending superiorly to the tentorium with mild mass effect on the medulla and no significant expansion of the internal auditory canal. Visualized orbits: Unremarkable. Mastoids and visualized paranasal sinuses: Clear. Skeleton: Mild diffuse cervical facet arthrosis. Moderate C1-2 arthrosis. Upper chest: Mild mosaic attenuation in the lung apices which may reflect air trapping. Other: None. IMPRESSION: 1. No abscess or other acute abnormality identified in the neck. 2. 3.3 cm right  cerebellopontine angle mass which may represent a meningioma or vestibular schwannoma. Contrast-enhanced brain MRI utilizing the IAC protocol is recommended on a nonemergent basis for further evaluation. 3.  Aortic Atherosclerosis (ICD10-I70.0). Electronically Signed   By: Logan Bores M.D.   On: 04/06/2018 15:15   Dg Chest Portable 1 View  Result Date: 04/08/2018 CLINICAL DATA:  Strep throat for 2 weeks, tachycardia EXAM: PORTABLE CHEST 1 VIEW COMPARISON:  03/23/2008 FINDINGS: Cardiac shadow is mildly enlarged. The lungs are well aerated bilaterally. Increased density is noted superimposed over the anterior aspect of the right fourth rib consistent with rib calcification. No focal infiltrate or effusion is seen. No acute bony abnormality is noted. IMPRESSION: No active disease. Electronically Signed   By: Inez Catalina M.D.   On: 04/08/2018 20:51     Subjective: Eager to go home  Discharge Exam: Vitals:   04/15/18 0918 04/15/18 1204  BP:  140/64  Pulse: 78 63  Resp:  19  Temp:  97.7 F (36.5 C)  SpO2:  100%   Vitals:   04/15/18 0821 04/15/18 0828 04/15/18 0918 04/15/18 1204  BP:  (!) 132/46  140/64  Pulse: (!) 48 (!) 49 78 63  Resp: 16 16  19   Temp:    97.7 F (36.5  C)  TempSrc:    Oral  SpO2: 100% 96%  100%  Weight:      Height:        General: Pt is alert, awake, not in acute distress Cardiovascular: RRR, S1/S2 +, no rubs, no gallops Respiratory: CTA bilaterally, no wheezing, no rhonchi Abdominal: Soft, NT, ND, bowel sounds + Extremities: no edema, no cyanosis   The results of significant diagnostics from this hospitalization (including imaging, microbiology, ancillary and laboratory) are listed below for reference.     Microbiology: Recent Results (from the past 240 hour(s))  Culture, blood (Routine x 2)     Status: None   Collection Time: 04/08/18  7:30 PM  Result Value Ref Range Status   Specimen Description BLOOD RIGHT ANTECUBITAL  Final   Special Requests    Final    BOTTLES DRAWN AEROBIC AND ANAEROBIC Blood Culture results may not be optimal due to an excessive volume of blood received in culture bottles   Culture   Final    NO GROWTH 5 DAYS Performed at [facility], [place] [address], [place], [place] [address]    Report Status 04/13/2018 FINAL  Final  Culture, blood (Routine x 2)     Status: None   Collection Time: 04/08/18  7:55 PM  Result Value Ref Range Status   Specimen Description BLOOD LEFT ANTECUBITAL  Final   Special Requests   Final    AEROBIC BOTTLE ONLY Blood Culture results may not be optimal due to an excessive volume of blood received in culture bottles   Culture   Final    NO GROWTH 5 DAYS Performed at [facility], [place] [address], [place], [place] [address]    Report Status 04/13/2018 FINAL  Final  MRSA PCR Screening     Status: None   Collection Time: 04/09/18  1:02 AM  Result Value Ref Range Status   MRSA by PCR NEGATIVE NEGATIVE Final    Comment:        The GeneXpert MRSA Assay (FDA approved for NASAL specimens only), is one component of a comprehensive MRSA colonization surveillance program. It is not intended to diagnose MRSA infection nor to guide or monitor treatment for MRSA infections. Performed at [facility], [place] [address] [place],  [address]      Labs: BNP (last 3 results) Recent Labs    04/08/18 2336  BNP 914.7*   Basic Metabolic Panel: Recent Labs  Lab 04/09/18 1014  04/11/18 0219 04/12/18 0224 04/13/18 0243 04/14/18 0205 04/15/18 0403  NA  --    < > 127* 133* 133* 135 136  K  --    < > 4.1 4.1 3.8 3.6 3.2*  CL  --    < > 92* 93* 95* 93* 96*  CO2  --    < > 26 30 29 31  32  GLUCOSE  --    < > 108* 110* 126* 111* 102*  BUN  --    < > 10 12 12 14 14   CREATININE  --    < > 0.69 0.87 0.69 0.79 0.73  CALCIUM  --    < > 8.5* 8.7* 8.8* 8.9 8.8*  MG 1.4*  --  1.7  --   --  1.6*  --    < > = values in this interval not displayed.   Liver Function  Tests: Recent Labs  Lab 04/08/18 1954  AST 24  ALT 24  ALKPHOS 155*  BILITOT  0.7  PROT 6.8  ALBUMIN 3.1*   No results for input(s): LIPASE, AMYLASE in the last 168 hours. No results for input(s): AMMONIA in the last 168 hours. CBC: Recent Labs  Lab 04/08/18 1954  04/10/18 0742 04/11/18 0219 04/12/18 0224 04/13/18 0243 04/15/18 0403  WBC 13.8*   < > 14.0* 11.1* 14.6* 13.0* 14.0*  NEUTROABS 12.1*  --   --   --   --   --  11.6*  HGB 10.8*   < > 9.9* 9.3* 10.5* 9.9* 10.1*  HCT 34.8*   < > 32.2* 30.3* 34.8* 31.9* 31.8*  MCV 85.1   < > 85.4 85.6 86.4 85.1 84.4  PLT 570*   < > 458* 455* 520* 481* 460*   < > = values in this interval not displayed.   Cardiac Enzymes: Recent Labs  Lab 04/08/18 2336 04/09/18 0303 04/09/18 1014  TROPONINI <0.03 <0.03 <0.03   BNP: Invalid input(s): POCBNP CBG: No results for input(s): GLUCAP in the last 168 hours. D-Dimer No results for input(s): DDIMER in the last 72 hours. Hgb A1c No results for input(s): HGBA1C in the last 72 hours. Lipid Profile No results for input(s): CHOL, HDL, LDLCALC, TRIG, CHOLHDL, LDLDIRECT in the last 72 hours. Thyroid function studies No results for input(s): TSH, T4TOTAL, T3FREE, THYROIDAB in the last 72 hours.  Invalid input(s): FREET3 Anemia work up No results for input(s): VITAMINB12, FOLATE, FERRITIN, TIBC, IRON, RETICCTPCT in the last 72 hours. Urinalysis    Component Value Date/Time   COLORURINE YELLOW 04/08/2018 2255   APPEARANCEUR CLEAR 04/08/2018 2255   LABSPEC 1.011 04/08/2018 2255   PHURINE 6.0 04/08/2018 2255   GLUCOSEU NEGATIVE 04/08/2018 2255   HGBUR NEGATIVE 04/08/2018 2255   BILIRUBINUR NEGATIVE 04/08/2018 2255   KETONESUR NEGATIVE 04/08/2018 2255   PROTEINUR NEGATIVE 04/08/2018 2255   UROBILINOGEN 0.2 03/23/2008 2017   NITRITE NEGATIVE 04/08/2018 2255   LEUKOCYTESUR NEGATIVE 04/08/2018 2255   Sepsis Labs Invalid input(s): PROCALCITONIN,  WBC,  LACTICIDVEN Microbiology Recent  Results (from the past 240 hour(s))  Culture, blood (Routine x 2)     Status: None   Collection Time: 04/08/18  7:30 PM  Result Value Ref Range Status   Specimen Description BLOOD RIGHT ANTECUBITAL  Final   Special Requests   Final    BOTTLES DRAWN AEROBIC AND ANAEROBIC Blood Culture results may not be optimal due to an excessive volume of blood received in culture bottles   Culture   Final    NO GROWTH 5 DAYS Performed at [facility], [place] [address] [place], [place] [address]    Report Status 04/13/2018 FINAL  Final  Culture, blood (Routine x 2)     Status: None   Collection Time: 04/08/18  7:55 PM  Result Value Ref Range Status   Specimen Description BLOOD LEFT ANTECUBITAL  Final   Special Requests   Final    AEROBIC BOTTLE ONLY Blood Culture results may not be optimal due to an excessive volume of blood received in culture bottles   Culture   Final    NO GROWTH 5 DAYS Performed at [facility], [place] [address] [place], [place] [address]    Report Status 04/13/2018 FINAL  Final  MRSA PCR Screening     Status: None   Collection Time: 04/09/18  1:02 AM  Result Value Ref Range Status   MRSA by PCR NEGATIVE NEGATIVE Final    Comment:        The GeneXpert  MRSA Assay (FDA approved for NASAL specimens only), is one component of a comprehensive MRSA colonization surveillance program. It is not intended to diagnose MRSA infection nor to guide or monitor treatment for MRSA infections. Performed at [facility], [place] [address] [place], [place] [address]    Time spent: 41min  SIGNED:   Marylu Lund, MD  [facility] 04/15/2018, 3:05 PM  If 7PM-7AM, please contact night-coverage [email or web address] Password [email or web address]

## 2018-04-15 NOTE — Progress Notes (Signed)
IV removed x2; belongings collected; educated on d/c instructions; given prescriptions; will continue to monitor and wheel out once ride is here.   [place]  Nica Friske, RN

## 2018-04-15 NOTE — Progress Notes (Signed)
Progress Note  Patient Name: Cynthia Combs Date of Encounter: 04/15/2018  Primary Cardiologist: new to [facility]   Subjective   [name] is a 77 year old female who was admitted with rapid atrial fibrillation.  She also has a history of hypertension, hyperlipidemia and hypothyroidism.  Is had her cardioversion and has maintained normal sinus rhythm..  Inpatient Medications    Scheduled Meds: . amLODipine  5 mg Oral Daily  . apixaban  5 mg Oral BID  . calcium-vitamin D  1 tablet Oral QHS  . cholecalciferol  2,000 Units Oral QHS  . diltiazem  90 mg Oral Q6H  . estradiol  0.5 mg Oral Q M,W,F  . folic acid  093 mcg Oral Daily  . furosemide  20 mg Intravenous Q12H  . levothyroxine  75 mcg Oral QAC breakfast  . metoprolol succinate  150 mg Oral Daily  . predniSONE  40 mg Oral Q breakfast  . simvastatin  10 mg Oral QHS  . sodium chloride flush  3 mL Intravenous Q12H  . vitamin C  1,000 mg Oral QHS   Continuous Infusions: . sodium chloride Stopped (04/15/18 0813)   PRN Meds: acetaminophen, hydrALAZINE, labetalol, menthol-cetylpyridinium, ondansetron **OR** ondansetron (ZOFRAN) IV, oxyCODONE-acetaminophen, phenol, polyethylene glycol, sodium chloride flush, zolpidem   Vital Signs    Vitals:   04/15/18 0821 04/15/18 0828 04/15/18 0918 04/15/18 1204  BP:  (!) 132/46  140/64  Pulse: (!) 48 (!) 49 78 63  Resp: 16 16  19   Temp:    97.7 F (36.5 C)  TempSrc:    Oral  SpO2: 100% 96%  100%  Weight:      Height:        Intake/Output Summary (Last 24 hours) at 04/15/2018 1412 Last data filed at 04/15/2018 1300 Gross per 24 hour  Intake 1550 ml  Output 3735 ml  Net -2185 ml   Filed Weights   04/14/18 0500 04/15/18 0546 04/15/18 0656  Weight: 202 lb 1.6 oz (91.7 kg) 203 lb 3.2 oz (92.2 kg) 203 lb (92.1 kg)    Telemetry    Normal sinus rhythm with occasional premature atrial contractions.- Personally Reviewed  ECG    Sinus rhythm- Personally Reviewed  Physical Exam   GEN:  No acute distress.   Neck: No JVD Cardiac: RRR, occas.  premature beat. Respiratory: Clear to auscultation bilaterally. GI: Soft, nontender, non-distended  MS: No edema; No deformity. Neuro:  Nonfocal  Psych: Normal affect   Labs    Chemistry Recent Labs  Lab 04/08/18 1954  04/13/18 0243 04/14/18 0205 04/15/18 0403  NA 133*   < > 133* 135 136  K 3.5   < > 3.8 3.6 3.2*  CL 91*   < > 95* 93* 96*  CO2 30   < > 29 31 32  GLUCOSE 157*   < > 126* 111* 102*  BUN 10   < > 12 14 14   CREATININE 0.95   < > 0.69 0.79 0.73  CALCIUM 8.9   < > 8.8* 8.9 8.8*  PROT 6.8  --   --   --   --   ALBUMIN 3.1*  --   --   --   --   AST 24  --   --   --   --   ALT 24  --   --   --   --   ALKPHOS 155*  --   --   --   --   BILITOT 0.7  --   --   --   --  GFRNONAA 57*   < > >60 >60 >60  GFRAA >60   < > >60 >60 >60  ANIONGAP 12   < > 9 11 8    < > = values in this interval not displayed.     Hematology Recent Labs  Lab 04/12/18 0224 04/13/18 0243 04/15/18 0403  WBC 14.6* 13.0* 14.0*  RBC 4.03 3.75* 3.77*  HGB 10.5* 9.9* 10.1*  HCT 34.8* 31.9* 31.8*  MCV 86.4 85.1 84.4  MCH 26.1 26.4 26.8  MCHC 30.2 31.0 31.8  RDW 15.9* 15.7* 15.8*  PLT 520* 481* 460*    Cardiac Enzymes Recent Labs  Lab 04/08/18 2336 04/09/18 0303 04/09/18 1014  TROPONINI <0.03 <0.03 <0.03   No results for input(s): TROPIPOC in the last 168 hours.   BNP Recent Labs  Lab 04/08/18 2336  BNP 154.7*     DDimer No results for input(s): DDIMER in the last 168 hours.   Radiology    No results found.  Cardiac Studies      Patient Profile     77 y.o. female admitted with strep pharyngitis and rapid atrial fibrillation.   Assessment & Plan    1.  Atrial fibrillation.  She was cardioverted today and has maintained normal sinus rhythm.  Continue Toprol-XL 150 mg a day.  We will start her on Cardizem CD 120 mg a day.  We will discontinue the amlodipine.  2.  Hypertension: We will start her on losartan 50  mg a day instead of the amlodipine.  She has an allergy to lisinopril but she informed me that the allergy was a cough.  3.  Hyperlipidemia: Plans per internal medicine team   We will plan on seeing her back in the office in several weeks for a follow-up visit.  For questions or updates, please contact [place] Please consult [email or web address] for contact info under Cardiology/STEMI.      Signed, Mertie Moores, MD  04/15/2018, 2:12 PM

## 2018-04-15 NOTE — Anesthesia Preprocedure Evaluation (Signed)
Anesthesia Evaluation  Patient identified by MRN, date of birth, ID band Patient awake    Reviewed: Allergy & Precautions, NPO status , Patient's Chart, lab work & pertinent test results  Airway Mallampati: II  TM Distance: >3 FB Neck ROM: Full    Dental no notable dental hx.    Pulmonary neg pulmonary ROS, former smoker,    Pulmonary exam normal breath sounds clear to auscultation       Cardiovascular hypertension, Pt. on medications negative cardio ROS Normal cardiovascular exam+ dysrhythmias Atrial Fibrillation  Rhythm:Regular Rate:Normal     Neuro/Psych negative neurological ROS  negative psych ROS   GI/Hepatic negative GI ROS, Neg liver ROS,   Endo/Other  negative endocrine ROSHypothyroidism   Renal/GU negative Renal ROS  negative genitourinary   Musculoskeletal negative musculoskeletal ROS (+)   Abdominal   Peds negative pediatric ROS (+)  Hematology negative hematology ROS (+)   Anesthesia Other Findings   Reproductive/Obstetrics negative OB ROS                             Anesthesia Physical Anesthesia Plan  ASA: III  Anesthesia Plan: General   Post-op Pain Management:    Induction: Intravenous  PONV Risk Score and Plan: 3 and Treatment may vary due to age or medical condition  Airway Management Planned: Mask  Additional Equipment:   Intra-op Plan:   Post-operative Plan:   Informed Consent: I have reviewed the patients History and Physical, chart, labs and discussed the procedure including the risks, benefits and alternatives for the proposed anesthesia with the patient or authorized representative who has indicated his/her understanding and acceptance.   Dental advisory given  Plan Discussed with: CRNA  Anesthesia Plan Comments:         Anesthesia Quick Evaluation

## 2018-04-15 NOTE — Transfer of Care (Signed)
Immediate Anesthesia Transfer of Care Note  Patient: Cynthia Combs  Procedure(s) Performed: CARDIOVERSION (N/A )  Patient Location: PACU  Anesthesia Type:General  Level of Consciousness: awake, alert , oriented and patient cooperative  Airway & Oxygen Therapy: Patient Spontanous Breathing and Patient connected to face mask oxygen  Post-op Assessment: Report given to RN and Post -op Vital signs reviewed and stable  Post vital signs: Reviewed and stable  Last Vitals:  Vitals Value Taken Time  BP    Temp    Pulse    Resp    SpO2      Last Pain:  Vitals:   04/15/18 0656  TempSrc: Oral  PainSc: 0-No pain      Patients Stated Pain Goal: 2 (16/10/96 0454)  Complications: No apparent anesthesia complications

## 2018-04-15 NOTE — Op Note (Signed)
DC Cardioversion  Patient anesthetized by anesthesia with propofol intravenously  WIth pads in AP position cardioversion attempted with 200 J synchronized biphasic energy   Unsuccessful  WIth chest compression, 200 J synchronized biphasic energy delivered with conversion to SR with frequent atrial ectopy (multifocal PACs)     Procedure without complication  12 lead EKG pending.

## 2018-04-15 NOTE — Discharge Instructions (Signed)

## 2018-04-15 NOTE — Interval H&P Note (Signed)
History and Physical Interval Note:  04/15/2018 8:00 AM  Cynthia Combs  has presented today for surgery, with the diagnosis of a fib  The various methods of treatment have been discussed with the patient and family. After consideration of risks, benefits and other options for treatment, the patient has consented to  Procedure(s): CARDIOVERSION (N/A) as a surgical intervention .  The patient's history has been reviewed, patient examined, no change in status, stable for surgery.  I have reviewed the patient's chart and labs.  Questions were answered to the patient's satisfaction.     Dorris Carnes

## 2018-04-15 NOTE — Anesthesia Postprocedure Evaluation (Signed)
Anesthesia Post Note  Patient: Cynthia Combs  Procedure(s) Performed: CARDIOVERSION (N/A )     Patient location during evaluation: Endoscopy Anesthesia Type: General Level of consciousness: awake and alert Pain management: pain level controlled Vital Signs Assessment: post-procedure vital signs reviewed and stable Respiratory status: spontaneous breathing, nonlabored ventilation and respiratory function stable Cardiovascular status: blood pressure returned to baseline and stable Postop Assessment: no apparent nausea or vomiting Anesthetic complications: no    Last Vitals:  Vitals:   04/15/18 0821 04/15/18 0828  BP:  (!) 132/46  Pulse: (!) 48 (!) 49  Resp: 16 16  Temp:    SpO2: 100% 96%    Last Pain:  Vitals:   04/15/18 0828  TempSrc:   PainSc: 0-No pain                 Lynda Rainwater

## 2018-04-17 ENCOUNTER — Encounter (HOSPITAL_COMMUNITY): Payer: Self-pay | Admitting: Internal Medicine

## 2018-04-28 ENCOUNTER — Encounter: Payer: Self-pay | Admitting: Nurse Practitioner

## 2018-05-02 ENCOUNTER — Ambulatory Visit (HOSPITAL_COMMUNITY)
Admission: RE | Admit: 2018-05-02 | Discharge: 2018-05-02 | Disposition: A | Discharge status: Home / Self Care | Payer: Medicare Other | Source: Ambulatory Visit | Attending: Nurse Practitioner | Admitting: Nurse Practitioner

## 2018-05-02 ENCOUNTER — Encounter (HOSPITAL_COMMUNITY): Payer: Self-pay | Admitting: Nurse Practitioner

## 2018-05-02 VITALS — BP 114/72 | HR 139 | Ht 73.0 in | Wt 203.0 lb

## 2018-05-02 DIAGNOSIS — I481 Persistent atrial fibrillation: Secondary | ICD-10-CM

## 2018-05-02 DIAGNOSIS — Z9049 Acquired absence of other specified parts of digestive tract: Secondary | ICD-10-CM | POA: Diagnosis not present

## 2018-05-02 DIAGNOSIS — Z87891 Personal history of nicotine dependence: Secondary | ICD-10-CM | POA: Diagnosis not present

## 2018-05-02 DIAGNOSIS — Z803 Family history of malignant neoplasm of breast: Secondary | ICD-10-CM | POA: Diagnosis not present

## 2018-05-02 DIAGNOSIS — Z90710 Acquired absence of both cervix and uterus: Secondary | ICD-10-CM | POA: Insufficient documentation

## 2018-05-02 DIAGNOSIS — E785 Hyperlipidemia, unspecified: Secondary | ICD-10-CM | POA: Diagnosis not present

## 2018-05-02 DIAGNOSIS — Z7901 Long term (current) use of anticoagulants: Secondary | ICD-10-CM | POA: Insufficient documentation

## 2018-05-02 DIAGNOSIS — J020 Streptococcal pharyngitis: Secondary | ICD-10-CM | POA: Insufficient documentation

## 2018-05-02 DIAGNOSIS — Z841 Family history of disorders of kidney and ureter: Secondary | ICD-10-CM | POA: Diagnosis not present

## 2018-05-02 DIAGNOSIS — Z9889 Other specified postprocedural states: Secondary | ICD-10-CM | POA: Insufficient documentation

## 2018-05-02 DIAGNOSIS — I10 Essential (primary) hypertension: Secondary | ICD-10-CM | POA: Insufficient documentation

## 2018-05-02 DIAGNOSIS — I4819 Other persistent atrial fibrillation: Secondary | ICD-10-CM

## 2018-05-02 DIAGNOSIS — Z8249 Family history of ischemic heart disease and other diseases of the circulatory system: Secondary | ICD-10-CM | POA: Diagnosis not present

## 2018-05-02 DIAGNOSIS — A409 Streptococcal sepsis, unspecified: Secondary | ICD-10-CM | POA: Insufficient documentation

## 2018-05-02 DIAGNOSIS — Z8371 Family history of colonic polyps: Secondary | ICD-10-CM | POA: Diagnosis not present

## 2018-05-02 DIAGNOSIS — E0590 Thyrotoxicosis, unspecified without thyrotoxic crisis or storm: Secondary | ICD-10-CM | POA: Insufficient documentation

## 2018-05-02 DIAGNOSIS — Z79891 Long term (current) use of opiate analgesic: Secondary | ICD-10-CM | POA: Diagnosis not present

## 2018-05-02 DIAGNOSIS — Z888 Allergy status to other drugs, medicaments and biological substances status: Secondary | ICD-10-CM | POA: Diagnosis not present

## 2018-05-02 DIAGNOSIS — Z79899 Other long term (current) drug therapy: Secondary | ICD-10-CM | POA: Diagnosis not present

## 2018-05-02 MED ORDER — MAGNESIUM 200 MG PO TABS: 200.0000 mg | ORAL_TABLET | Freq: Two times a day (BID) | ORAL | Status: DC

## 2018-05-02 MED ORDER — POTASSIUM CHLORIDE CRYS ER 20 MEQ PO TBCR: 20.0000 meq | EXTENDED_RELEASE_TABLET | Freq: Every day | ORAL | 3 refills | Status: DC

## 2018-05-02 NOTE — Patient Instructions (Signed)
Increase lasix to everyday  Start potassium to 36meq once a day  Start Magnesium to 200mg  twice a day - over the counter

## 2018-05-03 ENCOUNTER — Telehealth: Payer: Self-pay | Admitting: Pharmacist

## 2018-05-03 NOTE — Progress Notes (Signed)
Primary Care Physician: Crist Infante, MD Referring Physician: as above   Cynthia Combs is a 77 y.o. female that I saw initially 03/2017 with a h/o HTN that presented to her PCP c/o rt chest/ flank pain, and irregular heart beat was noted. Ekg showed afib rate controlled. She was placed on eliquis 5 mg bid for chadsvasc score of 4. She was already on metoprolol but the dose was increased. She is completely asymptomatic. Has not noted any irregular heart beat in the past. Denies any fatigue or shortness of breath. She denies smoking, snoring, alcohol or tobacco use. She is fairly sedentary.  F/u in afib clinic 04/2017. She continues on rate control drugs and is well rate controlled. She is on apixaban without issues. She continues to feel well and is totally unaware that she has afib.  Returns to [facility] clinic, 05/2015. She has continued in afib, rate controlled for the last month and wants to stay in rate controlled afib. She states that she is asymptomatic and does not want any further treatment. I discussed referring her on to general cardiology but she would like to discuss with pcp first. She is doing well on eliquis without bleeding issues.  Seeing pt back in afib clinic, 6/24 after hospitalization for strep throat with sepsis and afib with RVR and associated heart failure, thought triggered by sepsis and ongoing infection. Required cardizem  IV for rate control and because of persistent RVR, she was successfully cardioverted. On f/u with PCP recently, she was found to be back in afib with RVR. On arrival here after a long walk, her HR was 139 buit with rest, HR was controlled in the 80's. She is improving from her recent illness but still has lingering fatigue.Her lasix was recently reduced to every other day by PCP but pt has noted increased pedal edema. She believes she did miss one eliquis the first week out of hospital, so will count her 3 weeks uninterrupted anticoagulation as of 7/7.  Today,  she denies symptoms of palpitations, chest pain, shortness of breath, orthopnea, PND,     dizziness, presyncope, syncope, or neurologic sequela.+ fatigue/lower extremity edema. The patient is tolerating medications without difficulties and is otherwise without complaint today.   Past Medical History:  Diagnosis Date  . Anemia   . Atrial fibrillation ([place])   . Cyst    HX of   . Hemorrhoids   . Hyperlipemia   . Hypertension   . Hyperthyroidism   . Thyroid nodule    Past Surgical History:  Procedure Laterality Date  . ABDOMINAL EXPLORATION SURGERY    . ABDOMINAL HYSTERECTOMY    . APPENDECTOMY    . CARDIOVERSION N/A 04/15/2018   Procedure: CARDIOVERSION;  Surgeon: Fay Records, MD;  Location: [facility];  Service: Cardiovascular;  Laterality: N/A;  . CHOLECYSTECTOMY    . COLON SURGERY     Due to large cyst/tumor--Benign  . DILATION AND CURETTAGE OF UTERUS    . INCISE AND DRAIN ABCESS     left hand  . KNEE SURGERY     Right     Current Outpatient Medications  Medication Sig Dispense Refill  . acetaminophen (TYLENOL) 500 MG tablet Take 1,000 mg by mouth every 6 (six) hours as needed for headache (pain).    [name] apixaban (ELIQUIS) 5 MG TABS tablet Take 1 tablet (5 mg total) by mouth 2 (two) times daily. 60 tablet 0  . Ascorbic Acid (VITAMIN C) 1000 MG tablet Take 1,000 mg by mouth  at bedtime.     . Calcium Carb-Cholecalciferol (CALCIUM 600-D PO) Take 1,200 mg by mouth at bedtime.    . Cholecalciferol (VITAMIN D) 2000 units CAPS Take 2 capsules by mouth daily.    [name] diltiazem (CARDIZEM CD) 120 MG 24 hr capsule Take 1 capsule (120 mg total) by mouth daily. 30 capsule 0  . estradiol (ESTRACE) 0.5 MG tablet Take 0.25 mg by mouth every Monday, Wednesday, and Friday.     . folic acid (FOLVITE) 202 MCG tablet Take 400 mcg by mouth daily.    . furosemide (LASIX) 20 MG tablet Take 20 mg by mouth daily.    [name] HYDROcodone-acetaminophen (NORCO/VICODIN) 5-325 MG tablet Take 1-2 tablets by mouth  every 6 (six) hours as needed (pain).   0  . levothyroxine (SYNTHROID, LEVOTHROID) 75 MCG tablet Take 75 mcg by mouth daily before breakfast. DAW; PT TAKES NAME BRAND SYNTHROID    . losartan (COZAAR) 50 MG tablet Take 1 tablet (50 mg total) by mouth daily. 30 tablet 0  . metoprolol (TOPROL-XL) 200 MG 24 hr tablet Take 100 mg by mouth daily. PT TAKES WITH A 50 MG TABLET    . metoprolol succinate (TOPROL-XL) 50 MG 24 hr tablet Take 50 mg by mouth See admin instructions. Take one tablet (50 mg) by mouth with a 100 mg tablet for a total dose of 150 mg every morning    . simvastatin (ZOCOR) 20 MG tablet Take 20 mg by mouth at bedtime.     . Magnesium 200 MG TABS Take 1 tablet (200 mg total) by mouth 2 (two) times daily. 30 each   . potassium chloride SA (K-DUR,KLOR-CON) 20 MEQ tablet Take 1 tablet (20 mEq total) by mouth daily. 30 tablet 3   No current facility-administered medications for this encounter.     Allergies  Allergen Reactions  . Crestor [Rosuvastatin Calcium] Other (See Comments)    Muscle weakness  . Lisinopril Other (See Comments)    Unknown reaction - reported by [facility]  . Niacin And Related Other (See Comments)    Facial redness  . Other Other (See Comments)    Passed out after swine flu shot, couldn't move for several days  . Prevnar 13 [Pneumococcal 13-Val Conj Vacc] Other (See Comments)    Arm tingled for months after administration  . Singulair [Montelukast Sodium] Other (See Comments)    Blood pressure increased    Social History   Socioeconomic History  . Marital status: Divorced    Spouse name: Not on file  . Number of children: 2  . Years of education: Not on file  . Highest education level: Not on file  Occupational History  . Occupation: Therapist, sports     Comment: Retired   Scientific laboratory technician  . Financial resource strain: Not on file  . Food insecurity:    Worry: Not on file    Inability: Not on file  . Transportation needs:    Medical: Not on  file    Non-medical: Not on file  Tobacco Use  . Smoking status: Former Research scientist (life sciences)  . Smokeless tobacco: Never Used  Substance and Sexual Activity  . Alcohol use: No  . Drug use: No  . Sexual activity: Not on file  Lifestyle  . Physical activity:    Days per week: Not on file    Minutes per session: Not on file  . Stress: Not on file  Relationships  . Social connections:    Talks on phone: Not  on file    Gets together: Not on file    Attends religious service: Not on file    Active member of club or organization: Not on file    Attends meetings of clubs or organizations: Not on file    Relationship status: Not on file  . Intimate partner violence:    Fear of current or ex partner: Not on file    Emotionally abused: Not on file    Physically abused: Not on file    Forced sexual activity: Not on file  Other Topics Concern  . Not on file  Social History Narrative  . Not on file    Family History  Problem Relation Age of Onset  . Colon polyps Mother   . Heart disease Mother   . Colon polyps Sister   . Breast cancer Maternal Aunt   . Kidney disease Father   . Colon cancer Neg Hx     ROS- All systems are reviewed and negative except as per the HPI above  Physical Exam: Vitals:   05/02/18 1413  BP: 114/72  Pulse: (!) 139  Weight: 203 lb (92.1 kg)  Height: 6\' 1"  (1.854 m)   Wt Readings from Last 3 Encounters:  05/02/18 203 lb (92.1 kg)  04/15/18 203 lb (92.1 kg)  05/24/17 218 lb (98.9 kg)    Labs: Lab Results  Component Value Date   NA 136 04/15/2018   K 3.2 (L) 04/15/2018   CL 96 (L) 04/15/2018   CO2 32 04/15/2018   GLUCOSE 102 (H) 04/15/2018   BUN 14 04/15/2018   CREATININE 0.73 04/15/2018   CALCIUM 8.8 (L) 04/15/2018   MG 1.6 (L) 04/14/2018   Lab Results  Component Value Date   INR 1.41 04/15/2018   Lab Results  Component Value Date   CHOL 94 04/09/2018   HDL 34 (L) 04/09/2018   LDLCALC 46 04/09/2018   TRIG 69 04/09/2018     GEN- The patient  is well appearing, alert and oriented x 3 today.   Head- normocephalic, atraumatic Eyes-  Sclera clear, conjunctiva pink Ears- hearing intact Oropharynx- clear Neck- supple, no JVP Lymph- no cervical lymphadenopathy Lungs- Clear to ausculation bilaterally, normal work of breathing Heart-irregular rate and rhythm, no murmurs, rubs or gallops, PMI not laterally displaced GI- soft, NT, ND, + BS Extremities- no clubbing, cyanosis, or 1+ LE edema MS- no significant deformity or atrophy Skin- no rash or lesion Psych- euthymic mood, full affect Neuro- strength and sensation are intact  EKG-afib at 139 bpm , qrs ibnt 88 ms, qtc 456 ms Epic records reviewed, labs/ekg/notes reviewed form PCP, with creatinine at 0.9, K+ at 3.8, mag in hospital at 1.6,WBC at 11.58, RBC at 4.5 and HCt at 40.0, pt's at 387, liver panel normal Echo- 04/13/17-Study Conclusions  - Left ventricle: The cavity size was normal. There was mild   concentric hypertrophy. Systolic function was vigorous. The   estimated ejection fraction was in the range of 65% to 70%. Wall   motion was normal; there were no regional wall motion   abnormalities. - Left atrium: The atrium was at the upper limits of normal in   size. - Right atrium: The atrium was mildly dilated.  Assessment and Plan: 1. Persistent atrial fibrillation  Previously asymptomatic, but with recent illness associated with RVR and heart failure She did feel better in SR after cardioversion, but short lived BP fairly soft and am hesitant to increase rate control as she is controlled at  rest  Eliquis 5 mg bid for chadsvasc score of at least 4 Will go back to lasix 20 mg daily as she is noticing more LLE on qod Discussed antiarrythmic therapy with pt, she is not wanting amiodarone for the potential side effects and already having thyroid issues. I think, it may be difficult to maintain SR long term with pt as she was in afib for over a year, so Tikosyn my be best  option to try to restore and maintain SR long term Missed a  Dose of eliquis the week of  so will have to count 7/7 as uninterrupted anticoagulation, will be eligible to come into hosptial after that date Will send meds to PharmD to screen for any potential qtc prolonging drugs, no obvious ones noted No benadryl use She is willing to under go  hospitalization for this and will check into price of drug I will go ahead and start K+ at 20 meq daily and Magnesium OTC at 200 mg bid in preparation to get K+ over 4 and mag over 2.0 for tikosyn  2. Sepsis 2/2 Strep throat Recovering, per PCP   Pt will call back to  schedule her date to come into hospital for [place]. [place], [facility] [address] [place], [place] [address] [phone]

## 2018-05-03 NOTE — Telephone Encounter (Signed)
Medication list reviewed in anticipation of upcoming Tikosyn initiation. Patient is not taking any contraindicated or QTc prolonging medications. Potassium and magnesium were low on most recent panel, but pt has been started on supplementation yesterday.   Patient is anticoagulated on Eliquis on the appropriate dose. Pt did recently miss a dose and admission has been pushed out after 7/7 to ensure appropriate anticoagulation. Please ensure that patient has not missed any anticoagulation doses in the 3 weeks prior to Tikosyn initiation.   Patient will need to be counseled to avoid use of Benadryl while on Tikosyn and in the 2-3 days prior to Tikosyn initiation.

## 2018-05-17 ENCOUNTER — Encounter (HOSPITAL_COMMUNITY): Payer: Self-pay | Admitting: Nurse Practitioner

## 2018-05-17 ENCOUNTER — Ambulatory Visit (HOSPITAL_COMMUNITY)
Admission: RE | Admit: 2018-05-17 | Discharge: 2018-05-17 | Disposition: A | Discharge status: Home / Self Care | Payer: Medicare Other | Source: Ambulatory Visit | Attending: Nurse Practitioner | Admitting: Nurse Practitioner

## 2018-05-17 ENCOUNTER — Inpatient Hospital Stay (HOSPITAL_COMMUNITY)
Admission: AD | Admit: 2018-05-17 | Discharge: 2018-05-20 | DRG: 308 | Disposition: A | Discharge status: Home / Self Care | Payer: Medicare Other | Source: Ambulatory Visit | Attending: Internal Medicine | Admitting: Internal Medicine

## 2018-05-17 ENCOUNTER — Other Ambulatory Visit: Payer: Self-pay

## 2018-05-17 ENCOUNTER — Encounter (HOSPITAL_COMMUNITY): Payer: Self-pay | Admitting: *Deleted

## 2018-05-17 VITALS — BP 142/84 | HR 110 | Ht 73.0 in | Wt 207.0 lb

## 2018-05-17 DIAGNOSIS — Z87891 Personal history of nicotine dependence: Secondary | ICD-10-CM

## 2018-05-17 DIAGNOSIS — Z79899 Other long term (current) drug therapy: Secondary | ICD-10-CM

## 2018-05-17 DIAGNOSIS — E0590 Thyrotoxicosis, unspecified without thyrotoxic crisis or storm: Secondary | ICD-10-CM | POA: Diagnosis present

## 2018-05-17 DIAGNOSIS — I4819 Other persistent atrial fibrillation: Secondary | ICD-10-CM | POA: Diagnosis present

## 2018-05-17 DIAGNOSIS — I491 Atrial premature depolarization: Secondary | ICD-10-CM | POA: Diagnosis present

## 2018-05-17 DIAGNOSIS — I5033 Acute on chronic diastolic (congestive) heart failure: Secondary | ICD-10-CM | POA: Diagnosis not present

## 2018-05-17 DIAGNOSIS — Z7901 Long term (current) use of anticoagulants: Secondary | ICD-10-CM | POA: Diagnosis not present

## 2018-05-17 DIAGNOSIS — R402413 Glasgow coma scale score 13-15, at hospital admission: Secondary | ICD-10-CM | POA: Diagnosis present

## 2018-05-17 DIAGNOSIS — E041 Nontoxic single thyroid nodule: Secondary | ICD-10-CM | POA: Diagnosis present

## 2018-05-17 DIAGNOSIS — I481 Persistent atrial fibrillation: Principal | ICD-10-CM

## 2018-05-17 DIAGNOSIS — Z79890 Hormone replacement therapy (postmenopausal): Secondary | ICD-10-CM

## 2018-05-17 DIAGNOSIS — I110 Hypertensive heart disease with heart failure: Secondary | ICD-10-CM | POA: Diagnosis present

## 2018-05-17 DIAGNOSIS — Z888 Allergy status to other drugs, medicaments and biological substances status: Secondary | ICD-10-CM

## 2018-05-17 DIAGNOSIS — E785 Hyperlipidemia, unspecified: Secondary | ICD-10-CM | POA: Diagnosis present

## 2018-05-17 DIAGNOSIS — Z887 Allergy status to serum and vaccine status: Secondary | ICD-10-CM

## 2018-05-17 LAB — BASIC METABOLIC PANEL
ANION GAP: 10 (ref 5–15)
BUN: 12 mg/dL (ref 8–23)
CALCIUM: 9.2 mg/dL (ref 8.9–10.3)
CO2: 28 mmol/L (ref 22–32)
Chloride: 96 mmol/L — ABNORMAL LOW (ref 98–111)
Creatinine, Ser: 0.91 mg/dL (ref 0.44–1.00)
GFR, EST NON AFRICAN AMERICAN: 60 mL/min — AB (ref >60)
Glucose, Bld: 103 mg/dL — ABNORMAL HIGH (ref 70–99)
POTASSIUM: 4.2 mmol/L (ref 3.5–5.1)
SODIUM: 134 mmol/L — AB (ref 135–145)

## 2018-05-17 LAB — MAGNESIUM: MAGNESIUM: 1.9 mg/dL (ref 1.7–2.4)

## 2018-05-17 MED ORDER — APIXABAN 5 MG PO TABS
5.0000 mg | ORAL_TABLET | Freq: Two times a day (BID) | ORAL | Status: DC
  Administered 2018-05-17 – 2018-05-20 (×6): 5 mg via ORAL
  Filled 2018-05-17 (×6): qty 1

## 2018-05-17 MED ORDER — FUROSEMIDE 20 MG PO TABS
20.0000 mg | ORAL_TABLET | Freq: Every day | ORAL | Status: DC
  Administered 2018-05-18: 20 mg via ORAL
  Filled 2018-05-17: qty 1

## 2018-05-17 MED ORDER — METOPROLOL SUCCINATE ER 100 MG PO TB24: 100.0000 mg | ORAL_TABLET | Freq: Every day | ORAL | Status: DC

## 2018-05-17 MED ORDER — METOPROLOL SUCCINATE ER 50 MG PO TB24
150.0000 mg | ORAL_TABLET | Freq: Every day | ORAL | Status: DC
  Administered 2018-05-18 – 2018-05-20 (×3): 150 mg via ORAL
  Filled 2018-05-17 (×3): qty 3

## 2018-05-17 MED ORDER — SIMVASTATIN 20 MG PO TABS
20.0000 mg | ORAL_TABLET | Freq: Every day | ORAL | Status: DC
  Administered 2018-05-17 – 2018-05-19 (×3): 20 mg via ORAL
  Filled 2018-05-17 (×3): qty 1

## 2018-05-17 MED ORDER — ACETAMINOPHEN 500 MG PO TABS
1000.0000 mg | ORAL_TABLET | Freq: Four times a day (QID) | ORAL | Status: DC | PRN
  Administered 2018-05-17 – 2018-05-18 (×3): 1000 mg via ORAL
  Filled 2018-05-17 (×3): qty 2

## 2018-05-17 MED ORDER — SODIUM CHLORIDE 0.9% FLUSH: 3.0000 mL | INTRAVENOUS | Status: DC | PRN

## 2018-05-17 MED ORDER — LEVOTHYROXINE SODIUM 75 MCG PO TABS
75.0000 ug | ORAL_TABLET | Freq: Every day | ORAL | Status: DC
  Administered 2018-05-18 – 2018-05-20 (×3): 75 ug via ORAL
  Filled 2018-05-17 (×3): qty 1

## 2018-05-17 MED ORDER — DILTIAZEM HCL ER COATED BEADS 120 MG PO CP24
120.0000 mg | ORAL_CAPSULE | Freq: Every evening | ORAL | Status: DC
  Administered 2018-05-17 – 2018-05-19 (×3): 120 mg via ORAL
  Filled 2018-05-17 (×3): qty 1

## 2018-05-17 MED ORDER — SODIUM CHLORIDE 0.9% FLUSH
3.0000 mL | Freq: Two times a day (BID) | INTRAVENOUS | Status: DC
  Administered 2018-05-17 – 2018-05-19 (×5): 3 mL via INTRAVENOUS

## 2018-05-17 MED ORDER — MAGNESIUM OXIDE 400 (241.3 MG) MG PO TABS
200.0000 mg | ORAL_TABLET | Freq: Two times a day (BID) | ORAL | Status: DC
  Administered 2018-05-17 – 2018-05-20 (×6): 200 mg via ORAL
  Filled 2018-05-17 (×6): qty 1

## 2018-05-17 MED ORDER — DOFETILIDE 500 MCG PO CAPS
500.0000 ug | ORAL_CAPSULE | Freq: Two times a day (BID) | ORAL | Status: DC
  Administered 2018-05-17 – 2018-05-18 (×3): 500 ug via ORAL
  Filled 2018-05-17 (×3): qty 1

## 2018-05-17 MED ORDER — LOSARTAN POTASSIUM 50 MG PO TABS
50.0000 mg | ORAL_TABLET | Freq: Every evening | ORAL | Status: DC
  Administered 2018-05-17 – 2018-05-19 (×3): 50 mg via ORAL
  Filled 2018-05-17 (×4): qty 1

## 2018-05-17 MED ORDER — POTASSIUM CHLORIDE CRYS ER 20 MEQ PO TBCR
20.0000 meq | EXTENDED_RELEASE_TABLET | Freq: Every day | ORAL | Status: DC
  Administered 2018-05-18 – 2018-05-20 (×2): 20 meq via ORAL
  Filled 2018-05-17 (×2): qty 1

## 2018-05-17 MED ORDER — ESTRADIOL 0.5 MG PO TABS: 0.2500 mg | ORAL_TABLET | ORAL | Status: DC

## 2018-05-17 MED ORDER — SODIUM CHLORIDE 0.9 % IV SOLN: 250.0000 mL | INTRAVENOUS | Status: DC | PRN

## 2018-05-17 NOTE — H&P (Signed)
Primary Care Physician: Crist Infante, MD Referring Physician: as above   Cynthia Combs is a 77 y.o. female that I saw initially 03/2017 with a h/o HTN that presented to her PCP c/o rt chest/ flank pain, and irregular heart beat was noted. Ekg showed afib rate controlled. She was placed on eliquis 5 mg bid for chadsvasc score of 4. She was already on metoprolol but the dose was increased. She is completely asymptomatic. Has not noted any irregular heart beat in the past. Denies any fatigue or shortness of breath. She denies smoking, snoring, alcohol or tobacco use. She is fairly sedentary.  F/u in afib clinic 04/2017. She continues on rate control drugs and is well rate controlled. She is on apixaban without issues. She continues to feel well and is totally unaware that she has afib.  Returns to [place] clinic, 05/2017. She has continued in afib, rate controlled for the last month and wants to stay in rate controlled afib. She states that she is asymptomatic and does not want any further treatment. I discussed referring her on to general cardiology but she would like to discuss with pcp first. She is doing well on eliquis without bleeding issues.  Seeing pt back in afib clinic, 05/02/18, after hospitalization for strep throat with sepsis and afib with RVR and associated heart failure, thought triggered by sepsis and ongoing infection. Required cardizem  IV for rate control and because of persistent RVR, she was successfully cardioverted. On f/u with PCP recently, she was found to be back in afib with RVR. On arrival here after a long walk, her HR was 139 buit with rest, HR was controlled in the 80's. She is improving from her recent illness but still has lingering fatigue.Her lasix was recently reduced to every other day by PCP but pt has noted increased pedal edema. She believes she did miss one eliquis the first week out of hospital, so will count her 3 weeks uninterrupted anticoagulation as of 7/7.  F/u  in afib clinic, 7/9. She is here for Tikosyn admit. She has noted that she has had increased pedal edema over the last week and more so after Cardizem was started. Possibly this can be stopped if SR can be achieved. She does qualify for pt assistance thru [place]  and we have any paperwork to send in when her dose is decided at d/c. No missed does of anticoagulation and no  use of benadryl.  Today, she denies symptoms of palpitations, chest pain, shortness of breath, orthopnea, PND,     dizziness, presyncope, syncope, or neurologic sequela.+ fatigue/lower extremity edema. The patient is tolerating medications without difficulties and is otherwise without complaint today.       Past Medical History:  Diagnosis Date  . Anemia   . Atrial fibrillation ([place])   . Cyst    HX of   . Hemorrhoids   . Hyperlipemia   . Hypertension   . Hyperthyroidism   . Thyroid nodule         Past Surgical History:  Procedure Laterality Date  . ABDOMINAL EXPLORATION SURGERY    . ABDOMINAL HYSTERECTOMY    . APPENDECTOMY    . CARDIOVERSION N/A 04/15/2018   Procedure: CARDIOVERSION;  Surgeon: Fay Records, MD;  Location: [facility];  Service: Cardiovascular;  Laterality: N/A;  . CHOLECYSTECTOMY    . COLON SURGERY     Due to large cyst/tumor--Benign  . DILATION AND CURETTAGE OF UTERUS    . INCISE AND DRAIN ABCESS  left hand  . KNEE SURGERY     Right           Current Outpatient Medications  Medication Sig Dispense Refill  . acetaminophen (TYLENOL) 500 MG tablet Take 1,000 mg by mouth every 6 (six) hours as needed for moderate pain or headache.     [name] apixaban (ELIQUIS) 5 MG TABS tablet Take 1 tablet (5 mg total) by mouth 2 (two) times daily. (Patient taking differently: Take 5 mg by mouth 2 (two) times daily. 10 am and 10 pm) 60 tablet 0  . Ascorbic Acid (VITAMIN C) 1000 MG tablet Take 1,000 mg by mouth at bedtime.     . Calcium Carb-Cholecalciferol (CALCIUM 600-D  PO) Take 2 tablets by mouth at bedtime.     . Cholecalciferol (VITAMIN D) 2000 units CAPS Take 4,000 Units by mouth at bedtime.     [name] diltiazem (CARDIZEM CD) 120 MG 24 hr capsule Take 1 capsule (120 mg total) by mouth daily. (Patient taking differently: Take 120 mg by mouth every evening. 4 pm) 30 capsule 0  . estradiol (ESTRACE) 0.5 MG tablet Take 0.25 mg by mouth every Monday, Wednesday, and Friday.     . folic acid (FOLVITE) 573 MCG tablet Take 400 mcg by mouth daily.    . furosemide (LASIX) 20 MG tablet Take 20 mg by mouth daily.    [name] levothyroxine (SYNTHROID, LEVOTHROID) 75 MCG tablet Take 75 mcg by mouth daily before breakfast. DAW; PT TAKES NAME BRAND SYNTHROID    . losartan (COZAAR) 50 MG tablet Take 1 tablet (50 mg total) by mouth daily. (Patient taking differently: Take 50 mg by mouth every evening. 4 pm) 30 tablet 0  . Magnesium 250 MG TABS Take 1 tablet by mouth 2 (two) times daily. Pt has only been taking one tablet qd, will resume bid dose    . metoprolol (TOPROL-XL) 200 MG 24 hr tablet Take 100 mg by mouth daily. PT TAKES WITH A 50 MG TABLET    . metoprolol succinate (TOPROL-XL) 50 MG 24 hr tablet Take 50 mg by mouth daily. Take one tablet (50 mg) by mouth with a 100 mg tablet for a total dose of 150 mg every morning    . potassium chloride SA (K-DUR,KLOR-CON) 20 MEQ tablet Take 1 tablet (20 mEq total) by mouth daily. 30 tablet 3  . simvastatin (ZOCOR) 20 MG tablet Take 20 mg by mouth at bedtime.      No current facility-administered medications for this encounter.          Allergies  Allergen Reactions  . Crestor [Rosuvastatin Calcium] Other (See Comments)    Muscle weakness  . Lisinopril Other (See Comments)    Unknown reaction - reported by [facility]  . Niacin And Related Other (See Comments)    Facial redness  . Other Other (See Comments)    Passed out after swine flu shot, couldn't move for several days  . Prevnar 13  [Pneumococcal 13-Val Conj Vacc] Other (See Comments)    Arm tingled for months after administration  . Singulair [Montelukast Sodium] Other (See Comments)    Blood pressure increased    Social History        Socioeconomic History  . Marital status: Divorced    Spouse name: Not on file  . Number of children: 2  . Years of education: Not on file  . Highest education level: Not on file  Occupational History  . Occupation: Therapist, sports  Comment: Retired   Scientific laboratory technician  . Financial resource strain: Not on file  . Food insecurity:    Worry: Not on file    Inability: Not on file  . Transportation needs:    Medical: Not on file    Non-medical: Not on file  Tobacco Use  . Smoking status: Former Research scientist (life sciences)  . Smokeless tobacco: Never Used  Substance and Sexual Activity  . Alcohol use: No  . Drug use: No  . Sexual activity: Not on file  Lifestyle  . Physical activity:    Days per week: Not on file    Minutes per session: Not on file  . Stress: Not on file  Relationships  . Social connections:    Talks on phone: Not on file    Gets together: Not on file    Attends religious service: Not on file    Active member of club or organization: Not on file    Attends meetings of clubs or organizations: Not on file    Relationship status: Not on file  . Intimate partner violence:    Fear of current or ex partner: Not on file    Emotionally abused: Not on file    Physically abused: Not on file    Forced sexual activity: Not on file  Other Topics Concern  . Not on file  Social History Narrative  . Not on file         Family History  Problem Relation Age of Onset  . Colon polyps Mother   . Heart disease Mother   . Colon polyps Sister   . Breast cancer Maternal Aunt   . Kidney disease Father   . Colon cancer Neg Hx     ROS- All systems are reviewed and negative except as per the HPI above  Physical Exam:    Vitals:   05/17/18  1048  BP: (!) 142/84  Pulse: (!) 110  Weight: 207 lb (93.9 kg)  Height: 6\' 1"  (1.854 m)      Wt Readings from Last 3 Encounters:  05/17/18 207 lb (93.9 kg)  05/02/18 203 lb (92.1 kg)  04/15/18 203 lb (92.1 kg)    Labs: RecentLabs       Lab Results  Component Value Date   NA 136 04/15/2018   K 3.2 (L) 04/15/2018   CL 96 (L) 04/15/2018   CO2 32 04/15/2018   GLUCOSE 102 (H) 04/15/2018   BUN 14 04/15/2018   CREATININE 0.73 04/15/2018   CALCIUM 8.8 (L) 04/15/2018   MG 1.6 (L) 04/14/2018     RecentLabs       Lab Results  Component Value Date   INR 1.41 04/15/2018     RecentLabs       Lab Results  Component Value Date   CHOL 94 04/09/2018   HDL 34 (L) 04/09/2018   LDLCALC 46 04/09/2018   TRIG 69 04/09/2018       GEN- The patient is well appearing, alert and oriented x 3 today.   Head- normocephalic, atraumatic Eyes-  Sclera clear, conjunctiva pink Ears- hearing intact Oropharynx- clear Neck- supple, no JVP Lymph- no cervical lymphadenopathy Lungs- Clear to ausculation bilaterally, normal work of breathing Heart-irregular rate and rhythm, no murmurs, rubs or gallops, PMI not laterally displaced GI- soft, NT, ND, + BS Extremities- no clubbing, cyanosis, or 1+ LE edema MS- no significant deformity or atrophy Skin- no rash or lesion Psych- euthymic mood, full affect Neuro- strength and sensation are intact  EKG-afib at 110 bpm , qrs int 90 ms, qtc 465 ms, EKG's reviewed and show qtc ranging 440's to 460's Epic records reviewed  Echo- 04/13/17-Study Conclusions  - Left ventricle: The cavity size was normal. There was mild concentric hypertrophy. Systolic function was vigorous. The estimated ejection fraction was in the range of 65% to 70%. Wall motion was normal; there were no regional wall motion abnormalities. - Left atrium: The atrium was at the upper limits of normal in size. - Right atrium: The atrium was mildly  dilated.  Assessment and Plan: 1. Persistent atrial fibrillation  Persistent for  over a year  She is here for tikosyn admit Previously asymptomatic, but with recent illness associated with RVR, fatigue and heart failure She did feel better in SR after cardioversion, but short lived Eliquis 5 mg bid for chadsvasc score of at least 4, no missed doses for at least 3 weeks Continue lasix 20 mg daily as she is noticing more LLE recently, may need more lasix while in hospital, possibly can stop Cardizem,if can return to SR  anticoagulation  Sent meds to PharmD to screen for any potential qtc prolonging drugs, no obvious ones noted No benadryl use Bmet/mag today  2. Sepsis 2/2 Strep throat, June 2019 Resolved   Bed is ready so she  will go up to [place]. [place], [facility] [address] [place], [place] [address] [phone]  I have seen, examined the patient, and reviewed the above assessment and plan.  Changes to above are made where necessary.  On exam,i RRR.  Pt with refractory afib.  Will admit for initiation of tikosyn.  Reports compliance with eliquis without interruption.  Labs and ekg reviewed and ok to start tikosyn.  Co Sign: Thompson Grayer, MD 05/17/2018 4:48 PM

## 2018-05-17 NOTE — Care Management (Addendum)
CM consulted for benefits check for Tikosyn:   S/W [facility] @ [place] # [phone]   1. TIKOSYN 500 MC G BID  [place] # [phone]    2. DOFETILIDE 500 MC G BID  COVER- YES  CO-PAY- $ 95.00  TIER- 4 DRUG  PRIOR APPROVAL- NO   DEDUCTIBLE HAS BEEN MET   PREFERRED PHARMACY : YES   [facility]   CM met with the patient and she states she has already started the patient assistance paperwork for Tikosyn through the [facility]. Pt to follow up with them to verify they have submitted the paperwork. Pt feels she can afford 1 month of the Tikosyn if she has to before the assistance would start.  CM following for further d/c needs.

## 2018-05-17 NOTE — Progress Notes (Signed)
Primary Care Physician: Crist Infante, MD Referring Physician: as above   Cynthia Combs is a 77 y.o. female that I saw initially 03/2017 with a h/o HTN that presented to her PCP c/o rt chest/ flank pain, and irregular heart beat was noted. Ekg showed afib rate controlled. She was placed on eliquis 5 mg bid for chadsvasc score of 4. She was already on metoprolol but the dose was increased. She is completely asymptomatic. Has not noted any irregular heart beat in the past. Denies any fatigue or shortness of breath. She denies smoking, snoring, alcohol or tobacco use. She is fairly sedentary.  F/u in afib clinic 04/2017. She continues on rate control drugs and is well rate controlled. She is on apixaban without issues. She continues to feel well and is totally unaware that she has afib.  Returns to [facility] clinic, 05/2017. She has continued in afib, rate controlled for the last month and wants to stay in rate controlled afib. She states that she is asymptomatic and does not want any further treatment. I discussed referring her on to general cardiology but she would like to discuss with pcp first. She is doing well on eliquis without bleeding issues.  Seeing pt back in afib clinic, 05/02/18, after hospitalization for strep throat with sepsis and afib with RVR and associated heart failure, thought triggered by sepsis and ongoing infection. Required cardizem  IV for rate control and because of persistent RVR, she was successfully cardioverted. On f/u with PCP recently, she was found to be back in afib with RVR. On arrival here after a long walk, her HR was 139 buit with rest, HR was controlled in the 80's. She is improving from her recent illness but still has lingering fatigue.Her lasix was recently reduced to every other day by PCP but pt has noted increased pedal edema. She believes she did miss one eliquis the first week out of hospital, so will count her 3 weeks uninterrupted anticoagulation as of 7/7.  F/u  in afib clinic, 7/9. She is here for Tikosyn admit. She has noted that she has had increased pedal edema over the last week and more so after Cardizem was started. Possibly this can be stopped if SR can be achieved. She does qualify for pt assistance thru [place]  and we have any paperwork to send in when her dose is decided at d/c. No missed does of anticoagulation and no  use of benadryl.  Today, she denies symptoms of palpitations, chest pain, shortness of breath, orthopnea, PND,     dizziness, presyncope, syncope, or neurologic sequela.+ fatigue/lower extremity edema. The patient is tolerating medications without difficulties and is otherwise without complaint today.   Past Medical History:  Diagnosis Date  . Anemia   . Atrial fibrillation ([place])   . Cyst    HX of   . Hemorrhoids   . Hyperlipemia   . Hypertension   . Hyperthyroidism   . Thyroid nodule    Past Surgical History:  Procedure Laterality Date  . ABDOMINAL EXPLORATION SURGERY    . ABDOMINAL HYSTERECTOMY    . APPENDECTOMY    . CARDIOVERSION N/A 04/15/2018   Procedure: CARDIOVERSION;  Surgeon: Fay Records, MD;  Location: [place];  Service: Cardiovascular;  Laterality: N/A;  . CHOLECYSTECTOMY    . COLON SURGERY     Due to large cyst/tumor--Benign  . DILATION AND CURETTAGE OF UTERUS    . INCISE AND DRAIN ABCESS     left hand  . KNEE  SURGERY     Right     Current Outpatient Medications  Medication Sig Dispense Refill  . acetaminophen (TYLENOL) 500 MG tablet Take 1,000 mg by mouth every 6 (six) hours as needed for moderate pain or headache.     [name] apixaban (ELIQUIS) 5 MG TABS tablet Take 1 tablet (5 mg total) by mouth 2 (two) times daily. (Patient taking differently: Take 5 mg by mouth 2 (two) times daily. 10 am and 10 pm) 60 tablet 0  . Ascorbic Acid (VITAMIN C) 1000 MG tablet Take 1,000 mg by mouth at bedtime.     . Calcium Carb-Cholecalciferol (CALCIUM 600-D PO) Take 2 tablets by mouth at bedtime.     .  Cholecalciferol (VITAMIN D) 2000 units CAPS Take 4,000 Units by mouth at bedtime.     [name] diltiazem (CARDIZEM CD) 120 MG 24 hr capsule Take 1 capsule (120 mg total) by mouth daily. (Patient taking differently: Take 120 mg by mouth every evening. 4 pm) 30 capsule 0  . estradiol (ESTRACE) 0.5 MG tablet Take 0.25 mg by mouth every Monday, Wednesday, and Friday.     . folic acid (FOLVITE) 017 MCG tablet Take 400 mcg by mouth daily.    . furosemide (LASIX) 20 MG tablet Take 20 mg by mouth daily.    [name] levothyroxine (SYNTHROID, LEVOTHROID) 75 MCG tablet Take 75 mcg by mouth daily before breakfast. DAW; PT TAKES NAME BRAND SYNTHROID    . losartan (COZAAR) 50 MG tablet Take 1 tablet (50 mg total) by mouth daily. (Patient taking differently: Take 50 mg by mouth every evening. 4 pm) 30 tablet 0  . Magnesium 250 MG TABS Take 1 tablet by mouth 2 (two) times daily. Pt has only been taking one tablet qd, will resume bid dose    . metoprolol (TOPROL-XL) 200 MG 24 hr tablet Take 100 mg by mouth daily. PT TAKES WITH A 50 MG TABLET    . metoprolol succinate (TOPROL-XL) 50 MG 24 hr tablet Take 50 mg by mouth daily. Take one tablet (50 mg) by mouth with a 100 mg tablet for a total dose of 150 mg every morning    . potassium chloride SA (K-DUR,KLOR-CON) 20 MEQ tablet Take 1 tablet (20 mEq total) by mouth daily. 30 tablet 3  . simvastatin (ZOCOR) 20 MG tablet Take 20 mg by mouth at bedtime.      No current facility-administered medications for this encounter.     Allergies  Allergen Reactions  . Crestor [Rosuvastatin Calcium] Other (See Comments)    Muscle weakness  . Lisinopril Other (See Comments)    Unknown reaction - reported by [facility]  . Niacin And Related Other (See Comments)    Facial redness  . Other Other (See Comments)    Passed out after swine flu shot, couldn't move for several days  . Prevnar 13 [Pneumococcal 13-Val Conj Vacc] Other (See Comments)    Arm tingled for months after  administration  . Singulair [Montelukast Sodium] Other (See Comments)    Blood pressure increased    Social History   Socioeconomic History  . Marital status: Divorced    Spouse name: Not on file  . Number of children: 2  . Years of education: Not on file  . Highest education level: Not on file  Occupational History  . Occupation: Therapist, sports     Comment: Retired   Scientific laboratory technician  . Financial resource strain: Not on file  . Food insecurity:  Worry: Not on file    Inability: Not on file  . Transportation needs:    Medical: Not on file    Non-medical: Not on file  Tobacco Use  . Smoking status: Former Research scientist (life sciences)  . Smokeless tobacco: Never Used  Substance and Sexual Activity  . Alcohol use: No  . Drug use: No  . Sexual activity: Not on file  Lifestyle  . Physical activity:    Days per week: Not on file    Minutes per session: Not on file  . Stress: Not on file  Relationships  . Social connections:    Talks on phone: Not on file    Gets together: Not on file    Attends religious service: Not on file    Active member of club or organization: Not on file    Attends meetings of clubs or organizations: Not on file    Relationship status: Not on file  . Intimate partner violence:    Fear of current or ex partner: Not on file    Emotionally abused: Not on file    Physically abused: Not on file    Forced sexual activity: Not on file  Other Topics Concern  . Not on file  Social History Narrative  . Not on file    Family History  Problem Relation Age of Onset  . Colon polyps Mother   . Heart disease Mother   . Colon polyps Sister   . Breast cancer Maternal Aunt   . Kidney disease Father   . Colon cancer Neg Hx     ROS- All systems are reviewed and negative except as per the HPI above  Physical Exam: Vitals:   05/17/18 1048  BP: (!) 142/84  Pulse: (!) 110  Weight: 207 lb (93.9 kg)  Height: 6\' 1"  (1.854 m)   Wt Readings from Last 3 Encounters:  05/17/18 207 lb (93.9  kg)  05/02/18 203 lb (92.1 kg)  04/15/18 203 lb (92.1 kg)    Labs: Lab Results  Component Value Date   NA 136 04/15/2018   K 3.2 (L) 04/15/2018   CL 96 (L) 04/15/2018   CO2 32 04/15/2018   GLUCOSE 102 (H) 04/15/2018   BUN 14 04/15/2018   CREATININE 0.73 04/15/2018   CALCIUM 8.8 (L) 04/15/2018   MG 1.6 (L) 04/14/2018   Lab Results  Component Value Date   INR 1.41 04/15/2018   Lab Results  Component Value Date   CHOL 94 04/09/2018   HDL 34 (L) 04/09/2018   LDLCALC 46 04/09/2018   TRIG 69 04/09/2018     GEN- The patient is well appearing, alert and oriented x 3 today.   Head- normocephalic, atraumatic Eyes-  Sclera clear, conjunctiva pink Ears- hearing intact Oropharynx- clear Neck- supple, no JVP Lymph- no cervical lymphadenopathy Lungs- Clear to ausculation bilaterally, normal work of breathing Heart-irregular rate and rhythm, no murmurs, rubs or gallops, PMI not laterally displaced GI- soft, NT, ND, + BS Extremities- no clubbing, cyanosis, or 1+ LE edema MS- no significant deformity or atrophy Skin- no rash or lesion Psych- euthymic mood, full affect Neuro- strength and sensation are intact  EKG-afib at 110 bpm , qrs int 90 ms, qtc 465 ms, EKG's reviewed and show qtc ranging 440's to 460's Epic records reviewed  Echo- 04/13/17-Study Conclusions  - Left ventricle: The cavity size was normal. There was mild   concentric hypertrophy. Systolic function was vigorous. The   estimated ejection fraction was in the range of  65% to 70%. Wall   motion was normal; there were no regional wall motion   abnormalities. - Left atrium: The atrium was at the upper limits of normal in   size. - Right atrium: The atrium was mildly dilated.  Assessment and Plan: 1. Persistent atrial fibrillation  Persistent for  over a year  She is here for tikosyn admit Previously asymptomatic, but with recent illness associated with RVR, fatigue and heart failure She did feel better in SR  after cardioversion, but short lived Eliquis 5 mg bid for chadsvasc score of at least 4, no missed doses for at least 3 weeks Continue lasix 20 mg daily as she is noticing more LLE recently, may need more lasix while in hospital, possibly can stop Cardizem,if can return to SR  anticoagulation  Sent meds to PharmD to screen for any potential qtc prolonging drugs, no obvious ones noted No benadryl use Bmet/mag today  2. Sepsis 2/2 Strep throat, June 2019 Resolved   Bed is ready so she  will go up to [place]. [place], [facility] [address] [place], [place] [address] [phone]

## 2018-05-18 LAB — BASIC METABOLIC PANEL
ANION GAP: 8 (ref 5–15)
BUN: 12 mg/dL (ref 8–23)
CALCIUM: 8.5 mg/dL — AB (ref 8.9–10.3)
CO2: 26 mmol/L (ref 22–32)
Chloride: 102 mmol/L (ref 98–111)
Creatinine, Ser: 0.84 mg/dL (ref 0.44–1.00)
GLUCOSE: 97 mg/dL (ref 70–99)
Potassium: 3.9 mmol/L (ref 3.5–5.1)
SODIUM: 136 mmol/L (ref 135–145)

## 2018-05-18 LAB — MAGNESIUM: MAGNESIUM: 1.8 mg/dL (ref 1.7–2.4)

## 2018-05-18 MED ORDER — BENZONATATE 100 MG PO CAPS
100.0000 mg | ORAL_CAPSULE | Freq: Three times a day (TID) | ORAL | Status: DC | PRN
  Administered 2018-05-18 – 2018-05-19 (×3): 100 mg via ORAL
  Filled 2018-05-18 (×3): qty 1

## 2018-05-18 NOTE — Progress Notes (Addendum)
Spoke with Chanetta Marshall NP regarding post-tikosyn EKG reading QTc as 566. Per [employer], this reading is falsely high and patient will be able to receive her next scheduled dose of tikosyn this evening.

## 2018-05-18 NOTE — Progress Notes (Addendum)
   Electrophysiology Rounding Note  Patient Name: Cynthia Combs Date of Encounter: 05/18/2018   Electrophysiologist: Waynetta Metheny   Subjective   The patient is doing well today.  At this time, the patient denies chest pain, shortness of breath, or any new concerns.  She converted to SR after 1st dose of Tikosyn.  Inpatient Medications    Scheduled Meds: . apixaban  5 mg Oral BID  . diltiazem  120 mg Oral QPM  . dofetilide  500 mcg Oral BID  . furosemide  20 mg Oral Daily  . levothyroxine  75 mcg Oral QAC breakfast  . losartan  50 mg Oral QPM  . magnesium oxide  200 mg Oral BID  . metoprolol succinate  150 mg Oral Daily  . potassium chloride SA  20 mEq Oral Daily  . simvastatin  20 mg Oral QHS  . sodium chloride flush  3 mL Intravenous Q12H   Continuous Infusions: . sodium chloride     PRN Meds: sodium chloride, acetaminophen, sodium chloride flush   Vital Signs    Vitals:   05/17/18 1843 05/17/18 2005 05/18/18 0345 05/18/18 0346  BP: (!) 138/99 (!) 163/86  (!) 151/69  Pulse:  80  (!) 57  Resp:  18  15  Temp:  (!) 97.5 F (36.4 C)  (!) 97.5 F (36.4 C)  TempSrc:  Oral  Oral  SpO2:  100%  97%  Weight:   206 lb 9.6 oz (93.7 kg)   Height:        Intake/Output Summary (Last 24 hours) at 05/18/2018 0752 Last data filed at 05/18/2018 0600 Gross per 24 hour  Intake 720 ml  Output 3200 ml  Net -2480 ml   Filed Weights   05/17/18 1203 05/18/18 0345  Weight: 205 lb 14.4 oz (93.4 kg) 206 lb 9.6 oz (93.7 kg)    Physical Exam    GEN- The patient is well appearing, alert and oriented x 3 today.   Head- normocephalic, atraumatic Eyes-  Sclera clear, conjunctiva pink Ears- hearing intact Oropharynx- clear Neck- supple Lungs- Clear to ausculation bilaterally, normal work of breathing Heart- Regular rate and rhythm  GI- soft, NT, ND, + BS Extremities- no clubbing, cyanosis, or edema Skin- no rash or lesion Psych- euthymic mood, full affect Neuro- strength and  sensation are intact  Labs    Basic Metabolic Panel Recent Labs    05/17/18 1048 05/18/18 0448  NA 134* 136  K 4.2 3.9  CL 96* 102  CO2 28 26  GLUCOSE 103* 97  BUN 12 12  CREATININE 0.91 0.84  CALCIUM 9.2 8.5*  MG 1.9 1.8    Telemetry    AF -> SR (personally reviewed)  Radiology    No results found.   Patient Profile     Cynthia Combs is a 77 y.o. female admitted for Tikosyn load  Assessment & Plan    1.  Persistent atrial fibrillation Admitted for Tikosyn load Converted to SR overnight QTc, BMET, Mg ok Continue Eliquis for [facility] of 4  Plan discharge on Friday  Signed, Chanetta Marshall, NP  05/18/2018, 7:52 AM   I have seen, examined the patient, and reviewed the above assessment and plan.  Changes to above are made where necessary.  On exam, RRR.  Converted to sinus with tikosyn.  Continue tikosyn load.  Anticipate discharge on Friday.  Co Sign: Thompson Grayer, MD 05/18/2018 1:58 PM

## 2018-05-19 ENCOUNTER — Ambulatory Visit (HOSPITAL_COMMUNITY): Admit: 2018-05-19 | Payer: Medicare Other | Admitting: Cardiology

## 2018-05-19 ENCOUNTER — Encounter: Payer: Self-pay | Admitting: Physician Assistant

## 2018-05-19 ENCOUNTER — Encounter (HOSPITAL_COMMUNITY)
Admission: AD | Disposition: A | Discharge status: Home / Self Care | Payer: Self-pay | Source: Ambulatory Visit | Attending: Internal Medicine

## 2018-05-19 LAB — BASIC METABOLIC PANEL
Anion gap: 8 (ref 5–15)
BUN: 10 mg/dL (ref 8–23)
CHLORIDE: 102 mmol/L (ref 98–111)
CO2: 26 mmol/L (ref 22–32)
CREATININE: 0.83 mg/dL (ref 0.44–1.00)
Calcium: 8.6 mg/dL — ABNORMAL LOW (ref 8.9–10.3)
GFR calc Af Amer: >60 mL/min (ref >60)
GFR calc non Af Amer: >60 mL/min (ref >60)
Glucose, Bld: 118 mg/dL — ABNORMAL HIGH (ref 70–99)
POTASSIUM: 3.9 mmol/L (ref 3.5–5.1)
Sodium: 136 mmol/L (ref 135–145)

## 2018-05-19 LAB — MAGNESIUM: Magnesium: 1.7 mg/dL (ref 1.7–2.4)

## 2018-05-19 SURGERY — CARDIOVERSION
Anesthesia: General

## 2018-05-19 MED ORDER — POTASSIUM CHLORIDE CRYS ER 20 MEQ PO TBCR
40.0000 meq | EXTENDED_RELEASE_TABLET | Freq: Once | ORAL | Status: AC
  Administered 2018-05-19: 40 meq via ORAL
  Filled 2018-05-19: qty 2

## 2018-05-19 MED ORDER — FUROSEMIDE 20 MG PO TABS
20.0000 mg | ORAL_TABLET | Freq: Every day | ORAL | Status: DC
  Administered 2018-05-20: 20 mg via ORAL
  Filled 2018-05-19: qty 1

## 2018-05-19 MED ORDER — OFF THE BEAT BOOK
Freq: Once | Status: DC
  Filled 2018-05-19: qty 1

## 2018-05-19 MED ORDER — FUROSEMIDE 10 MG/ML IJ SOLN
40.0000 mg | Freq: Once | INTRAMUSCULAR | Status: AC
  Administered 2018-05-19: 40 mg via INTRAVENOUS
  Filled 2018-05-19: qty 4

## 2018-05-19 NOTE — Discharge Instructions (Signed)

## 2018-05-19 NOTE — Care Management Note (Signed)
Case Management Note  Patient Details  Name: Cynthia Combs MRN: [id number] Date of Birth: Sep 02, 1941  Subjective/Objective:      Pt admitted with persistent atrial fibrillation. She is from home alone.               Action/Plan: Plan is for patient to d/c home when medically stable. CM following for d/c needs.   Expected Discharge Date:                  Expected Discharge Plan:  Home/Self Care  In-House Referral:     Discharge planning Services     Post Acute Care Choice:    Choice offered to:     DME Arranged:    DME Agency:     HH Arranged:    HH Agency:     Status of Service:  In process, will continue to follow  If discussed at Long Length of Stay Meetings, dates discussed:    Additional Comments:  Pollie Friar, RN 05/19/2018, 2:57 PM

## 2018-05-19 NOTE — Progress Notes (Signed)
Dr. Elson Areas, called back regarding patient, he has ordered BMP and Mg panel to check electrolytes.  If patient keeps this trend SVT and NSVT all night, Tykosin dose may possibly be reduce in the morning.  I will keep monitoring patient.

## 2018-05-19 NOTE — Progress Notes (Addendum)
Electrophysiology Rounding Note  Patient Name: Cynthia Combs Date of Encounter: 05/19/2018    Subjective   The patient is doing well today.  At this time, the patient denies chest pain, shortness of breath, or any new concerns. +Torsades last night   Inpatient Medications    Scheduled Meds: . apixaban  5 mg Oral BID  . diltiazem  120 mg Oral QPM  . furosemide  40 mg Intravenous Once  . [START ON 05/20/2018] furosemide  20 mg Oral Daily  . levothyroxine  75 mcg Oral QAC breakfast  . losartan  50 mg Oral QPM  . magnesium oxide  200 mg Oral BID  . metoprolol succinate  150 mg Oral Daily  . potassium chloride SA  20 mEq Oral Daily  . potassium chloride  40 mEq Oral Once  . simvastatin  20 mg Oral QHS  . sodium chloride flush  3 mL Intravenous Q12H   Continuous Infusions: . sodium chloride     PRN Meds: sodium chloride, acetaminophen, benzonatate, sodium chloride flush   Vital Signs    Vitals:   05/18/18 1401 05/18/18 1757 05/18/18 2113 05/19/18 0440  BP: (!) 141/72 (!) 167/73 (!) 172/84 (!) 161/79  Pulse: (!) 55  73 (!) 58  Resp:   18   Temp: 97.6 F (36.4 C)  97.9 F (36.6 C) 97.8 F (36.6 C)  TempSrc: Oral  Oral Oral  SpO2: 100%  97% 97%  Weight:      Height:        Intake/Output Summary (Last 24 hours) at 05/19/2018 0855 Last data filed at 05/19/2018 0657 Gross per 24 hour  Intake 2117 ml  Output 3650 ml  Net -1533 ml   Filed Weights   05/17/18 1203 05/18/18 0345  Weight: 205 lb 14.4 oz (93.4 kg) 206 lb 9.6 oz (93.7 kg)    Physical Exam    GEN- The patient is well appearing, alert and oriented x 3 today.   Head- normocephalic, atraumatic Eyes-  Sclera clear, conjunctiva pink Ears- hearing intact Oropharynx- clear Neck- supple Lungs- Clear to ausculation bilaterally, normal work of breathing Heart- Irregular rate and rhythm  GI- soft, NT, ND, + BS Extremities- no clubbing, cyanosis, or edema Skin- no rash or lesion Psych- euthymic mood, full  affect Neuro- strength and sensation are intact  Labs    Basic Metabolic Panel Recent Labs    05/18/18 0448 05/19/18 0247  NA 136 136  K 3.9 3.9  CL 102 102  CO2 26 26  GLUCOSE 97 118*  BUN 12 10  CREATININE 0.84 0.83  CALCIUM 8.5* 8.6*  MG 1.8 1.7     Telemetry    SR with PAC's, runs of torsades last night (personally reviewed)  Radiology    No results found.   Patient Profile     Cynthia Combs is a 77 y.o. female admitted for tikosyn load  Assessment & Plan    1.  Persistent atrial fibrillation Non sustained torsades last night Stop tikosyn Will monitor in hospital today  Alternate AAD therapy options reviewed - she isn't sure she wants to take anything.  Will discuss Flecainide again tomorrow Continue Eliquis for [facility] of 4 Keep K>3.9, Mg >1.8  2.  Acute on chronic diastolic heart failure With persistent LE edema Will give 1 dose of IV lasix today with K+ supplementation  Signed, Chanetta Marshall, NP  05/19/2018, 8:55 AM   EP Attending  Patient seen and examined. Agree with above. The patient has  had tdp on dofetilide. We will stop, watch in the hospital overnight, and plan for DC tomorrow. We will consider flecainide as she feels poorly in atrial fib. We will continue her systemic anti-coagulation. She is c/o lower extremity swelling and some sob so we will give a single dose of lasix. She is not decompensated.  Mikle Bosworth.D.

## 2018-05-19 NOTE — Progress Notes (Signed)
Patient having several episodes of SVT over 20 beats at times, also having NSVT.  I have pages on-call provider.  Patient has only complained of some palpitations but otherwise feeling no other symptoms.  I will keep monitoring the patient.

## 2018-05-19 NOTE — Discharge Summary (Addendum)
ELECTROPHYSIOLOGY PROCEDURE DISCHARGE SUMMARY    Patient ID: Cynthia Combs,  MRN: [id number], DOB/AGE: 77/19/1942 77 y.o.  Admit date: 05/17/2018 Discharge date: 05/20/2018  Primary Care Physician: Crist Infante, MD Electrophysiologist: Lovena Le  Primary Discharge Diagnosis:  1.  Persistent atrial fibrillation status post Tikosyn loading this admission  Secondary Discharge Diagnosis:  1.  HTN 2.  Hyperlipidemia  Allergies  Allergen Reactions  . Crestor [Rosuvastatin Calcium] Other (See Comments)    Muscle weakness  . Dofetilide     Torsades  . Lisinopril Other (See Comments)    Unknown reaction - reported by [facility]  . Niacin And Related Other (See Comments)    Facial redness  . Other Other (See Comments)    Passed out after swine flu shot, couldn't move for several days  . Prevnar 13 [Pneumococcal 13-Val Conj Vacc] Other (See Comments)    Arm tingled for months after administration  . Singulair [Montelukast Sodium] Other (See Comments)    Blood pressure increased     Procedures This Admission:  1.  Tikosyn loading  Brief HPI: Cynthia Combs is a 77 y.o. female with a past medical history as noted above.  They were referred to EP in the outpatient setting for treatment options of atrial fibrillation.  Risks, benefits, and alternatives to Tikosyn were reviewed with the patient who wished to proceed.    Hospital Course:  The patient was admitted and Tikosyn was initiated.  Renal function and electrolytes were followed during the hospitalization.  Their QTc remained stable.  She developed nonsustained torsades 05/18/18 and her Tikosyn was discontinued. They were monitored until discharge on telemetry which demonstrated SR with frequent PAC's.  On the day of discharge, they were examined by Dr Lovena Le who considered them stable for discharge to home.  Follow-up has been arranged with AF clinic in 1 week .  Flecainide can be considered for recurrent  AF  Physical Exam: Vitals:   05/19/18 0440 05/19/18 1519 05/19/18 1949 05/20/18 0613  BP: (!) 161/79 (!) 161/71 (!) 166/71 (!) 156/98  Pulse: (!) 58 84 69 67  Resp:   18 20  Temp: 97.8 F (36.6 C) 98.4 F (36.9 C) 98 F (36.7 C) 98.3 F (36.8 C)  TempSrc: Oral Oral Oral Oral  SpO2: 97% 99% 100% 97%  Weight:    201 lb 11.2 oz (91.5 kg)  Height:        GEN- The patient is well appearing, alert and oriented x 3 today.   HEENT: normocephalic, atraumatic; sclera clear, conjunctiva pink; hearing intact; oropharynx clear; neck supple Lungs- Clear to ausculation bilaterally, normal work of breathing.  No wheezes, rales, rhonchi Heart- Regular rate and rhythm  GI- soft, non-tender, non-distended, bowel sounds present  Extremities- no clubbing, cyanosis, or edema  MS- no significant deformity or atrophy Skin- warm and dry, no rash or lesion Psych- euthymic mood, full affect Neuro- strength and sensation are intact   Labs:   Lab Results  Component Value Date   WBC 14.0 (H) 04/15/2018   HGB 10.1 (L) 04/15/2018   HCT 31.8 (L) 04/15/2018   MCV 84.4 04/15/2018   PLT 460 (H) 04/15/2018    Recent Labs  Lab 05/20/18 0342  NA 137  K 3.4*  CL 100  CO2 26  BUN 10  CREATININE 0.75  CALCIUM 8.6*  GLUCOSE 104*     Discharge Medications:  Allergies as of 05/20/2018      Reactions   Crestor [rosuvastatin Calcium] Other (  See Comments)   Muscle weakness   Dofetilide    Torsades   Lisinopril Other (See Comments)   Unknown reaction - reported by [facility]   Niacin And Related Other (See Comments)   Facial redness   Other Other (See Comments)   Passed out after swine flu shot, couldn't move for several days   Prevnar 13 [pneumococcal 13-val Conj Vacc] Other (See Comments)   Arm tingled for months after administration   Singulair [montelukast Sodium] Other (See Comments)   Blood pressure increased      Medication List    TAKE these medications    acetaminophen 500 MG tablet Commonly known as:  TYLENOL Take 1,000 mg by mouth every 6 (six) hours as needed for moderate pain or headache.   apixaban 5 MG Tabs tablet Commonly known as:  ELIQUIS Take 1 tablet (5 mg total) by mouth 2 (two) times daily. What changed:  additional instructions   CALCIUM 600-D PO Take 2 tablets by mouth at bedtime.   diltiazem 120 MG 24 hr capsule Commonly known as:  CARDIZEM CD Take 1 capsule (120 mg total) by mouth daily. What changed:    when to take this  additional instructions   estradiol 0.5 MG tablet Commonly known as:  ESTRACE Take 0.25 mg by mouth every Monday, Wednesday, and Friday.   folic acid 673 MCG tablet Commonly known as:  FOLVITE Take 400 mcg by mouth daily.   furosemide 20 MG tablet Commonly known as:  LASIX Take 20 mg by mouth daily.   levothyroxine 75 MCG tablet Commonly known as:  SYNTHROID, LEVOTHROID Take 75 mcg by mouth daily before breakfast. DAW; PT TAKES NAME BRAND SYNTHROID   losartan 50 MG tablet Commonly known as:  COZAAR Take 1 tablet (50 mg total) by mouth daily. What changed:    when to take this  additional instructions   Magnesium 250 MG Tabs Take 1 tablet by mouth daily.   metoprolol succinate 50 MG 24 hr tablet Commonly known as:  TOPROL-XL Take 50 mg by mouth daily. Take one tablet (50 mg) by mouth with a 100 mg tablet for a total dose of 150 mg every morning   metoprolol 200 MG 24 hr tablet Commonly known as:  TOPROL-XL Take 100 mg by mouth daily. PT TAKES WITH A 50 MG TABLET   potassium chloride SA 20 MEQ tablet Commonly known as:  K-DUR,KLOR-CON Take 1 tablet (20 mEq total) by mouth daily.   simvastatin 20 MG tablet Commonly known as:  ZOCOR Take 20 mg by mouth at bedtime.   vitamin C 1000 MG tablet Take 1,000 mg by mouth at bedtime.   Vitamin D 2000 units Caps Take 4,000 Units by mouth at bedtime.       Disposition:  Discharge Instructions    Diet - low sodium heart  healthy   Complete by:  As directed    Increase activity slowly   Complete by:  As directed      Follow-up Information    [facility] Follow up on 05/30/2018.   Specialty:  Cardiology Why:  at 1:30PM  Contact information: [address] [id number] [place] [phone]          Duration of Discharge Encounter: Greater than 30 minutes including physician time.  Signed, Chanetta Marshall, NP 05/20/2018 8:07 AM  EP Attending  Patient seen and examined. Agree with above. The patient is stable for DC home. She will followup  to consider flecainide as an outpatient.   Mikle Bosworth.D.

## 2018-05-19 NOTE — Plan of Care (Signed)
Tikosyn d/c'd. Afib managed with Toprol and Cardizem PO. Eliquis for Anticoagulation. VSS. Tolerates well OOB in room.

## 2018-05-20 LAB — BASIC METABOLIC PANEL
Anion gap: 11 (ref 5–15)
BUN: 10 mg/dL (ref 8–23)
CO2: 26 mmol/L (ref 22–32)
Calcium: 8.6 mg/dL — ABNORMAL LOW (ref 8.9–10.3)
Chloride: 100 mmol/L (ref 98–111)
Creatinine, Ser: 0.75 mg/dL (ref 0.44–1.00)
GFR calc Af Amer: >60 mL/min (ref >60)
GFR calc non Af Amer: >60 mL/min (ref >60)
Glucose, Bld: 104 mg/dL — ABNORMAL HIGH (ref 70–99)
Potassium: 3.4 mmol/L — ABNORMAL LOW (ref 3.5–5.1)
Sodium: 137 mmol/L (ref 135–145)

## 2018-05-20 LAB — MAGNESIUM: Magnesium: 1.8 mg/dL (ref 1.7–2.4)

## 2018-05-20 NOTE — Progress Notes (Signed)
At 1140   Discharge instructions reviewed with pt, Copy of instructions given to pt, no new meds, no scripts.  Pt waiting for her sister to arrive for transportation home.    At 39   Pt d/c'd via wheelchair with belongings, sister waiting at main entrance.       Escorted by unit NT.

## 2018-05-30 ENCOUNTER — Encounter (HOSPITAL_COMMUNITY): Payer: Self-pay | Admitting: Nurse Practitioner

## 2018-05-30 ENCOUNTER — Ambulatory Visit (HOSPITAL_COMMUNITY)
Admission: RE | Admit: 2018-05-30 | Discharge: 2018-05-30 | Disposition: A | Discharge status: Home / Self Care | Payer: Medicare Other | Source: Ambulatory Visit | Attending: Nurse Practitioner | Admitting: Nurse Practitioner

## 2018-05-30 VITALS — BP 140/66 | HR 71 | Ht 73.0 in | Wt 198.0 lb

## 2018-05-30 DIAGNOSIS — Z87891 Personal history of nicotine dependence: Secondary | ICD-10-CM | POA: Insufficient documentation

## 2018-05-30 DIAGNOSIS — Z9049 Acquired absence of other specified parts of digestive tract: Secondary | ICD-10-CM | POA: Insufficient documentation

## 2018-05-30 DIAGNOSIS — I481 Persistent atrial fibrillation: Secondary | ICD-10-CM

## 2018-05-30 DIAGNOSIS — I4819 Other persistent atrial fibrillation: Secondary | ICD-10-CM

## 2018-05-30 DIAGNOSIS — Z888 Allergy status to other drugs, medicaments and biological substances status: Secondary | ICD-10-CM | POA: Diagnosis not present

## 2018-05-30 DIAGNOSIS — E785 Hyperlipidemia, unspecified: Secondary | ICD-10-CM | POA: Diagnosis not present

## 2018-05-30 DIAGNOSIS — I498 Other specified cardiac arrhythmias: Secondary | ICD-10-CM | POA: Insufficient documentation

## 2018-05-30 DIAGNOSIS — Z7901 Long term (current) use of anticoagulants: Secondary | ICD-10-CM | POA: Diagnosis not present

## 2018-05-30 DIAGNOSIS — J020 Streptococcal pharyngitis: Secondary | ICD-10-CM | POA: Insufficient documentation

## 2018-05-30 DIAGNOSIS — Z79890 Hormone replacement therapy (postmenopausal): Secondary | ICD-10-CM | POA: Diagnosis not present

## 2018-05-30 DIAGNOSIS — A409 Streptococcal sepsis, unspecified: Secondary | ICD-10-CM | POA: Diagnosis not present

## 2018-05-30 DIAGNOSIS — R9431 Abnormal electrocardiogram [ECG] [EKG]: Secondary | ICD-10-CM | POA: Insufficient documentation

## 2018-05-30 DIAGNOSIS — I110 Hypertensive heart disease with heart failure: Secondary | ICD-10-CM | POA: Insufficient documentation

## 2018-05-30 DIAGNOSIS — I509 Heart failure, unspecified: Secondary | ICD-10-CM | POA: Insufficient documentation

## 2018-05-30 DIAGNOSIS — I4891 Unspecified atrial fibrillation: Secondary | ICD-10-CM | POA: Diagnosis present

## 2018-05-30 DIAGNOSIS — E0590 Thyrotoxicosis, unspecified without thyrotoxic crisis or storm: Secondary | ICD-10-CM | POA: Insufficient documentation

## 2018-05-30 DIAGNOSIS — Z79899 Other long term (current) drug therapy: Secondary | ICD-10-CM | POA: Diagnosis not present

## 2018-05-30 NOTE — Progress Notes (Signed)
Primary Care Physician: Crist Infante, MD Referring Physician: as above   Cynthia Combs is a 77 y.o. female that I saw initially 03/2017 with a h/o HTN that presented to her PCP c/o rt chest/ flank pain, and irregular heart beat was noted. Ekg showed afib rate controlled. She was placed on eliquis 5 mg bid for chadsvasc score of 4. She was already on metoprolol but the dose was increased. She is completely asymptomatic. Has not noted any irregular heart beat in the past. Denies any fatigue or shortness of breath. She denies smoking, snoring, alcohol or tobacco use. She is fairly sedentary.  F/u in afib clinic 04/2017. She continues on rate control drugs and is well rate controlled. She is on apixaban without issues. She continues to feel well and is totally unaware that she has afib.  Returns to [facility], 05/2017. She has continued in afib, rate controlled for the last month and wants to stay in rate controlled afib. She states that she is asymptomatic and does not want any further treatment. I discussed referring her on to general cardiology but she would like to discuss with pcp first. She is doing well on eliquis without bleeding issues.  Seeing pt back in afib clinic, 05/02/18, after hospitalization for strep throat with sepsis and afib with RVR and associated heart failure, thought triggered by sepsis and ongoing infection. Required cardizem  IV for rate control and because of persistent RVR, she was successfully cardioverted. On f/u with PCP recently, she was found to be back in afib with RVR. On arrival here after a long walk, her HR was 139 buit with rest, HR was controlled in the 80's. She is improving from her recent illness but still has lingering fatigue.Her lasix was recently reduced to every other day by PCP but pt has noted increased pedal edema. She believes she did miss one eliquis the first week out of hospital, so will count her 3 weeks uninterrupted anticoagulation as of 7/7.  F/u  in afib clinic, 7/9. She is here for Tikosyn admit. She has noted that she has had increased pedal edema over the last week and more so after Cardizem was started. Possibly this can be stopped if SR can be achieved. She does qualify for pt assistance thru [employer]  and we have any paperwork to send in when her dose is decided at d/c. No missed does of anticoagulation and no  use of benadryl.  F/u in afib clinic, 7/22. Unfortunately, pt developed unsustained torsades and Tikosyn was stopped. She did convert on drug and fortunately she is in SR today. She feels very well.  Today, she denies symptoms of palpitations, chest pain, shortness of breath, orthopnea, PND,     dizziness, presyncope, syncope, or neurologic sequela.+ fatigue/lower extremity edema. The patient is tolerating medications without difficulties and is otherwise without complaint today.   Past Medical History:  Diagnosis Date  . Anemia   . Atrial fibrillation ([place])   . Cyst    HX of   . Hemorrhoids   . Hyperlipemia   . Hypertension   . Hyperthyroidism   . Thyroid nodule    Past Surgical History:  Procedure Laterality Date  . ABDOMINAL EXPLORATION SURGERY    . ABDOMINAL HYSTERECTOMY    . APPENDECTOMY    . CARDIOVERSION N/A 04/15/2018   Procedure: CARDIOVERSION;  Surgeon: Fay Records, MD;  Location: [facility];  Service: Cardiovascular;  Laterality: N/A;  . CHOLECYSTECTOMY    . COLON SURGERY  Due to large cyst/tumor--Benign  . DILATION AND CURETTAGE OF UTERUS    . INCISE AND DRAIN ABCESS     left hand  . KNEE SURGERY     Right     Current Outpatient Medications  Medication Sig Dispense Refill  . acetaminophen (TYLENOL) 500 MG tablet Take 1,000 mg by mouth every 6 (six) hours as needed for moderate pain or headache.     [name] apixaban (ELIQUIS) 5 MG TABS tablet Take 1 tablet (5 mg total) by mouth 2 (two) times daily. (Patient taking differently: Take 5 mg by mouth 2 (two) times daily. 10 am and 10 pm) 60 tablet 0  .  Ascorbic Acid (VITAMIN C) 1000 MG tablet Take 1,000 mg by mouth at bedtime.     . Calcium Carb-Cholecalciferol (CALCIUM 600-D PO) Take 2 tablets by mouth at bedtime.     . Cholecalciferol (VITAMIN D) 2000 units CAPS Take 4,000 Units by mouth at bedtime.     [name] diltiazem (CARDIZEM CD) 120 MG 24 hr capsule Take 1 capsule (120 mg total) by mouth daily. (Patient taking differently: Take 120 mg by mouth every evening. 4 pm) 30 capsule 0  . estradiol (ESTRACE) 0.5 MG tablet Take 0.25 mg by mouth every Monday, Wednesday, and Friday.     . folic acid (FOLVITE) 419 MCG tablet Take 400 mcg by mouth daily.    . furosemide (LASIX) 20 MG tablet Take 20 mg by mouth daily.    [name] levothyroxine (SYNTHROID, LEVOTHROID) 75 MCG tablet Take 75 mcg by mouth daily before breakfast. DAW; PT TAKES NAME BRAND SYNTHROID    . losartan (COZAAR) 50 MG tablet Take 1 tablet (50 mg total) by mouth daily. (Patient taking differently: Take 50 mg by mouth every evening. 4 pm) 30 tablet 0  . Magnesium 250 MG TABS Take 1 tablet by mouth daily.     . metoprolol (TOPROL-XL) 200 MG 24 hr tablet Take 100 mg by mouth daily. PT TAKES WITH A 50 MG TABLET    . metoprolol succinate (TOPROL-XL) 50 MG 24 hr tablet Take 50 mg by mouth daily. Take one tablet (50 mg) by mouth with a 100 mg tablet for a total dose of 150 mg every morning    . potassium chloride SA (K-DUR,KLOR-CON) 20 MEQ tablet Take 1 tablet (20 mEq total) by mouth daily. 30 tablet 3  . simvastatin (ZOCOR) 20 MG tablet Take 20 mg by mouth at bedtime.      No current facility-administered medications for this encounter.     Allergies  Allergen Reactions  . Crestor [Rosuvastatin Calcium] Other (See Comments)    Muscle weakness  . Dofetilide     Torsades  . Lisinopril Other (See Comments)    Unknown reaction - reported by [facility]  . Niacin And Related Other (See Comments)    Facial redness  . Other Other (See Comments)    Passed out after swine flu shot,  couldn't move for several days  . Prevnar 13 [Pneumococcal 13-Val Conj Vacc] Other (See Comments)    Arm tingled for months after administration  . Singulair [Montelukast Sodium] Other (See Comments)    Blood pressure increased    Social History   Socioeconomic History  . Marital status: Divorced    Spouse name: Not on file  . Number of children: 2  . Years of education: Not on file  . Highest education level: Not on file  Occupational History  . Occupation: Therapist, sports  Comment: Retired   Scientific laboratory technician  . Financial resource strain: Not on file  . Food insecurity:    Worry: Not on file    Inability: Not on file  . Transportation needs:    Medical: Not on file    Non-medical: Not on file  Tobacco Use  . Smoking status: Former Research scientist (life sciences)  . Smokeless tobacco: Never Used  Substance and Sexual Activity  . Alcohol use: No  . Drug use: No  . Sexual activity: Not on file  Lifestyle  . Physical activity:    Days per week: Not on file    Minutes per session: Not on file  . Stress: Not on file  Relationships  . Social connections:    Talks on phone: Not on file    Gets together: Not on file    Attends religious service: Not on file    Active member of club or organization: Not on file    Attends meetings of clubs or organizations: Not on file    Relationship status: Not on file  . Intimate partner violence:    Fear of current or ex partner: Not on file    Emotionally abused: Not on file    Physically abused: Not on file    Forced sexual activity: Not on file  Other Topics Concern  . Not on file  Social History Narrative  . Not on file    Family History  Problem Relation Age of Onset  . Colon polyps Mother   . Heart disease Mother   . Colon polyps Sister   . Breast cancer Maternal Aunt   . Kidney disease Father   . Colon cancer Neg Hx     ROS- All systems are reviewed and negative except as per the HPI above  Physical Exam: Vitals:   05/30/18 1339  BP: 140/66    Pulse: 71  Weight: 198 lb (89.8 kg)  Height: 6\' 1"  (1.854 m)   Wt Readings from Last 3 Encounters:  05/30/18 198 lb (89.8 kg)  05/20/18 201 lb 11.2 oz (91.5 kg)  05/17/18 207 lb (93.9 kg)    Labs: Lab Results  Component Value Date   NA 137 05/20/2018   K 3.4 (L) 05/20/2018   CL 100 05/20/2018   CO2 26 05/20/2018   GLUCOSE 104 (H) 05/20/2018   BUN 10 05/20/2018   CREATININE 0.75 05/20/2018   CALCIUM 8.6 (L) 05/20/2018   MG 1.8 05/20/2018   Lab Results  Component Value Date   INR 1.41 04/15/2018   Lab Results  Component Value Date   CHOL 94 04/09/2018   HDL 34 (L) 04/09/2018   LDLCALC 46 04/09/2018   TRIG 69 04/09/2018     GEN- The patient is well appearing, alert and oriented x 3 today.   Head- normocephalic, atraumatic Eyes-  Sclera clear, conjunctiva pink Ears- hearing intact Oropharynx- clear Neck- supple, no JVP Lymph- no cervical lymphadenopathy Lungs- Clear to ausculation bilaterally, normal work of breathing Heart-irregular rate and rhythm, no murmurs, rubs or gallops, PMI not laterally displaced GI- soft, NT, ND, + BS Extremities- no clubbing, cyanosis, or trace LE edema MS- no significant deformity or atrophy Skin- no rash or lesion Psych- euthymic mood, full affect Neuro- strength and sensation are intact  EKG- NSR at 71 bpm, pr int 168 ms, qrs int 82 ms, qtc 454 ms Epic records reviewed  Echo- 04/13/17-Study Conclusions  - Left ventricle: The cavity size was normal. There was mild   concentric hypertrophy.  Systolic function was vigorous. The   estimated ejection fraction was in the range of 65% to 70%. Wall   motion was normal; there were no regional wall motion   abnormalities. - Left atrium: The atrium was at the upper limits of normal in   size. - Right atrium: The atrium was mildly dilated.  Assessment and Plan: 1. Persistent atrial fibrillation  Persistent for over a year  Tikosyn did convert pt but stopped 2/2 unsustained  torsades Maintains in SR Previously asymptomatic, in afib for over a year, but with recent illness associated with RVR, fatigue and heart failure which made pt more symptomatic  Eliquis 5 mg bid for chadsvasc score of at least 4   2. Sepsis 2/2 Strep throat, June 2019 Resolved  F/u 7/30 with Richardson Dopp, PA-C afib clinic as needed  Geroge Baseman. Esaias Cleavenger, [facility] [address] [place], [place] [address] [phone]

## 2018-05-31 ENCOUNTER — Other Ambulatory Visit: Payer: Self-pay | Admitting: Internal Medicine

## 2018-05-31 DIAGNOSIS — D320 Benign neoplasm of cerebral meninges: Secondary | ICD-10-CM

## 2018-06-07 ENCOUNTER — Ambulatory Visit: Payer: Medicare Other | Admitting: Physician Assistant

## 2018-06-07 ENCOUNTER — Encounter: Payer: Self-pay | Admitting: Physician Assistant

## 2018-06-07 VITALS — BP 144/80 | HR 63 | Ht 73.0 in | Wt 199.0 lb

## 2018-06-07 DIAGNOSIS — I272 Pulmonary hypertension, unspecified: Secondary | ICD-10-CM | POA: Diagnosis not present

## 2018-06-07 DIAGNOSIS — M7989 Other specified soft tissue disorders: Secondary | ICD-10-CM | POA: Diagnosis not present

## 2018-06-07 DIAGNOSIS — I5032 Chronic diastolic (congestive) heart failure: Secondary | ICD-10-CM | POA: Diagnosis not present

## 2018-06-07 DIAGNOSIS — I481 Persistent atrial fibrillation: Secondary | ICD-10-CM

## 2018-06-07 DIAGNOSIS — I10 Essential (primary) hypertension: Secondary | ICD-10-CM

## 2018-06-07 DIAGNOSIS — I4819 Other persistent atrial fibrillation: Secondary | ICD-10-CM

## 2018-06-07 MED ORDER — FUROSEMIDE 40 MG PO TABS: 40.0000 mg | ORAL_TABLET | Freq: Every day | ORAL | 3 refills | Status: AC

## 2018-06-07 NOTE — Progress Notes (Signed)
Cardiology Office Note:    Date:  06/07/2018   ID:  Cynthia Combs, DOB 14-Jun-1941, MRN [id number]  PCP:  Crist Infante, MD  Cardiologist:  Mertie Moores, MD  AFib Clinic: Roderic Palau, NP   Referring MD: Crist Infante, MD   Chief Complaint  Patient presents with  . Hospitalization Follow-up    AF with RVR s/p DCCV in 6/19; admx for Tikosyn in 7/19    History of Present Illness:    Cynthia Combs is a 77 y.o. female with persistent atrial fibrillation, hypertension, hyperlipidemia, hypothyroidism.  She has mainly been followed in the atrial fibrillation clinic.  She initially saw Dr. Acie Fredrickson during a hospitalization for atrial fibrillation with rapid ventricular rate in the setting of sepsis in June 2019.  She ultimately underwent cardioversion with restoration of normal sinus rhythm.  She had volume excess in the setting of fluid resuscitation requiring IV Lasix.  Unfortunately, she had return of atrial fibrillation at follow-up in the atrial fibrillation clinic.  She was therefore set up for dofetilide load.  She converted to sinus rhythm with this, however she developed torsades and the medication was discontinued.  She also required IV Lasix for volume overload.  The discharge summary does indicate that flecainide could be considered in the future for control of atrial fibrillation.  She was last seen in the atrial fibrillation clinic 05/30/2018 and it was recommended that she follow-up here (follow-up in the atrial fibrillation clinic as needed).  Cynthia Combs returns for follow-up.  She is here alone.  She denies any recurrent symptoms of atrial fibrillation.  She denies chest discomfort, significant shortness of breath, PND, syncope.  She denies any bleeding issues.  She continues to have issues with lower extremity swelling.  She currently takes Lasix 40 mg every other day alternating with 20 mg every other day.  Prior CV studies:   The following studies were reviewed today:  Echo  04/09/18 Mild LVH, EF 55-60, normal wall motion, mild MR, reduced RVSF, mild RAE, moderate TR, mild PI, PASP 55, small pericardial effusion  Echo 04/13/2017 Vigorous LVF, EF 65-70, LA upper limits of normal, mild RAE  Past Medical History:  Diagnosis Date  . Anemia   . Atrial fibrillation ([place])   . Cyst    HX of   . Hemorrhoids   . Hyperlipemia   . Hypertension   . Hyperthyroidism   . Thyroid nodule    Surgical Hx: The patient  has a past surgical history that includes Incise and drain abcess; Abdominal hysterectomy; Appendectomy; Cholecystectomy; Abdominal exploration surgery; Colon surgery; Dilation and curettage of uterus; Knee surgery; and Cardioversion (N/A, 04/15/2018).   Current Medications: Current Meds  Medication Sig  . acetaminophen (TYLENOL) 500 MG tablet Take 1,000 mg by mouth every 6 (six) hours as needed for moderate pain or headache.   [name] apixaban (ELIQUIS) 5 MG TABS tablet Take 1 tablet (5 mg total) by mouth 2 (two) times daily. (Patient taking differently: Take 5 mg by mouth 2 (two) times daily. 10 am and 10 pm)  . Ascorbic Acid (VITAMIN C) 1000 MG tablet Take 1,000 mg by mouth at bedtime.   . Calcium Carb-Cholecalciferol (CALCIUM 600-D PO) Take 2 tablets by mouth at bedtime.   . Cholecalciferol (VITAMIN D) 2000 units CAPS Take 4,000 Units by mouth at bedtime.   [name] diltiazem (CARDIZEM CD) 120 MG 24 hr capsule Take 1 capsule (120 mg total) by mouth daily. (Patient taking differently: Take 120 mg by mouth every evening. 4  pm)  . estradiol (ESTRACE) 0.5 MG tablet Take 0.25 mg by mouth every Monday, Wednesday, and Friday.   . folic acid (FOLVITE) 619 MCG tablet Take 400 mcg by mouth daily.  [name] levothyroxine (SYNTHROID, LEVOTHROID) 75 MCG tablet Take 75 mcg by mouth daily before breakfast. DAW; PT TAKES Combs BRAND SYNTHROID  . losartan (COZAAR) 50 MG tablet Take 1 tablet (50 mg total) by mouth daily. (Patient taking differently: Take 50 mg by mouth every evening. 4 pm)  .  Magnesium 250 MG TABS Take 1 tablet by mouth daily.   . metoprolol (TOPROL-XL) 200 MG 24 hr tablet Take 100 mg by mouth daily. PT TAKES WITH A 50 MG TABLET  . metoprolol succinate (TOPROL-XL) 50 MG 24 hr tablet Take 50 mg by mouth daily. Take one tablet (50 mg) by mouth with a 100 mg tablet for a total dose of 150 mg every morning  . potassium chloride SA (K-DUR,KLOR-CON) 20 MEQ tablet Take 1 tablet (20 mEq total) by mouth daily.  . simvastatin (ZOCOR) 20 MG tablet Take 20 mg by mouth at bedtime.   . [DISCONTINUED] furosemide (LASIX) 20 MG tablet Take 20 mg by mouth daily.     Allergies:   Crestor [rosuvastatin calcium]; Dofetilide; Lisinopril; Niacin and related; Other; Prevnar 13 [pneumococcal 13-val conj vacc]; and Singulair [montelukast sodium]   Social History   Tobacco Use  . Smoking status: Former Research scientist (life sciences)  . Smokeless tobacco: Never Used  Substance Use Topics  . Alcohol use: No  . Drug use: No     Family Hx: The patient's family history includes Breast cancer in her maternal aunt; Colon polyps in her mother and sister; Heart disease in her mother; Kidney disease in her father. There is no history of Colon cancer.  ROS:   Please see the history of present illness.    Review of Systems  Cardiovascular: Positive for leg swelling.   All other systems reviewed and are negative.   EKGs/Labs/Other Test Reviewed:    EKG:  EKG is  ordered today.  The ekg ordered today demonstrates normal sinus rhythm, heart rate 63, normal axis, PACs, QTC 425, septal Q waves  Recent Labs: 04/08/2018: ALT 24; B Natriuretic Peptide 154.7 04/09/2018: TSH 1.413 04/15/2018: Hemoglobin 10.1; Platelets 460 05/20/2018: BUN 10; Creatinine, Ser 0.75; Magnesium 1.8; Potassium 3.4; Sodium 137   Recent Lipid Panel Lab Results  Component Value Date/Time   CHOL 94 04/09/2018 03:03 AM   TRIG 69 04/09/2018 03:03 AM   HDL 34 (L) 04/09/2018 03:03 AM   CHOLHDL 2.8 04/09/2018 03:03 AM   LDLCALC 46 04/09/2018 03:03  AM    Physical Exam:    VS:  BP (!) 144/80   Pulse 63   Ht 6\' 1"  (1.854 m)   Wt 199 lb (90.3 kg)   BMI 26.25 kg/m     Wt Readings from Last 3 Encounters:  06/07/18 199 lb (90.3 kg)  05/30/18 198 lb (89.8 kg)  05/20/18 201 lb 11.2 oz (91.5 kg)     Physical Exam  Constitutional: She is oriented to person, place, and time. She appears well-developed and well-nourished. No distress.  HENT:  Head: Normocephalic and atraumatic.  Eyes: No scleral icterus.  Neck: No JVD present.  Cardiovascular: Normal rate and regular rhythm.  No murmur heard. Pulmonary/Chest: Effort normal. She has no rales.  Abdominal: Soft.  Musculoskeletal: She exhibits no edema.  Neurological: She is alert and oriented to person, place, and time.  Skin: Skin is warm and  dry.  Psychiatric: She has a normal mood and affect.    ASSESSMENT & PLAN:    Persistent atrial fibrillation [facility]) She is currently maintaining normal sinus rhythm since she was recently admitted to the hospital for dofetilide load.  She is symptomatic when she has recurrent atrial fibrillation.  She has not noticed any symptoms since she left the hospital.  She does have lower extremity swelling.  I question whether or not this may be related to Cardizem.  However, she is currently doing well and I am hesitant to stop her Cardizem at this point.  If we cannot control her swelling with other measures, we may need to stop the Cardizem and increase her dose of metoprolol succinate.  Continue current dose of Apixaban.  Arrange follow-up BMET, CBC.  Leg swelling Question if this is related to side effects from Cardizem.  I will adjust her Lasix to see if this helps.  I have also asked her to use compression stockings.  -Please Lasix to 40 mg daily  -BMET today, repeat 1 week  Chronic diastolic CHF (congestive heart failure) ([place]) She had episodes of volume excess while in the hospital for sepsis.  This occurred in the setting of fluid  resuscitation.  She also developed volume excess while admitted for dofetilide load.  I suspect her volume excess was mainly related to acute illness.  However, she is doing well without significant shortness of breath on diuretic therapy.  Adjust Lasix as noted above.  Pulmonary hypertension, unspecified ([place]) PASP was 55 on echocardiogram during hospitalization in June 2019.  This was in the setting of sepsis and volume excess.  I suspect her pulmonary hypertension was related to volume overload.  Prior echocardiogram demonstrated no significant elevation in pulmonary pressures.  Consider repeat echo in 6 to 12 months.  Essential hypertension I reviewed blood pressure readings from home.  Her blood pressures are typically well controlled.  Continue current therapy.   Dispo:  Return in about 1 month (around 07/05/2018) for Close Follow Up, w/ Dr. Acie Fredrickson, or Richardson Dopp, PA-C.   Medication Adjustments/Labs and Tests Ordered: Current medicines are reviewed at length with the patient today.  Concerns regarding medicines are outlined above.  Tests Ordered: Orders Placed This Encounter  Procedures  . Basic Metabolic Panel (BMET)  . Basic Metabolic Panel (BMET)  . CBC  . EKG 12-Lead   Medication Changes: Meds ordered this encounter  Medications  . furosemide (LASIX) 40 MG tablet    Sig: Take 1 tablet (40 mg total) by mouth daily.    Dispense:  90 tablet    Refill:  3    Signed, Richardson Dopp, PA-C  06/07/2018 4:03 PM    [facility] [address], [place], [place]  [address] Phone: [phone]; Fax: [phone]

## 2018-06-07 NOTE — Patient Instructions (Addendum)
Medication Instructions:  1. INCREASE LASIX TO 40 MG DAILY; NEW RX HAS BEEN SENT   Labwork: TODAY BMET  BMET, CBC TO BE DONE IN 1 WEEK  Testing/Procedures: NONE ORDERED TODAY  Follow-Up: Richardson Dopp, [facility] ON 07/20/18 @ 1:45 PM   Any Other Special Instructions Will Be Listed Below (If Applicable).     If you need a refill on your cardiac medications before your next appointment, please call your pharmacy.

## 2018-06-08 ENCOUNTER — Ambulatory Visit
Admission: RE | Admit: 2018-06-08 | Discharge: 2018-06-08 | Disposition: A | Discharge status: Home / Self Care | Payer: Medicare Other | Source: Ambulatory Visit | Attending: Internal Medicine | Admitting: Internal Medicine

## 2018-06-08 DIAGNOSIS — D320 Benign neoplasm of cerebral meninges: Secondary | ICD-10-CM

## 2018-06-08 LAB — BASIC METABOLIC PANEL
BUN/Creatinine Ratio: 19 (ref 12–28)
BUN: 15 mg/dL (ref 8–27)
CALCIUM: 9.4 mg/dL (ref 8.7–10.3)
CHLORIDE: 91 mmol/L — AB (ref 96–106)
CO2: 25 mmol/L (ref 20–29)
Creatinine, Ser: 0.79 mg/dL (ref 0.57–1.00)
GFR, EST AFRICAN AMERICAN: 84 mL/min/{1.73_m2} (ref >59)
GFR, EST NON AFRICAN AMERICAN: 73 mL/min/{1.73_m2} (ref >59)
Glucose: 86 mg/dL (ref 65–99)
Potassium: 5.1 mmol/L (ref 3.5–5.2)
Sodium: 132 mmol/L — ABNORMAL LOW (ref 134–144)

## 2018-06-09 ENCOUNTER — Telehealth: Payer: Self-pay

## 2018-06-09 NOTE — Telephone Encounter (Signed)
On 06/08/18 at 62 I called patient in follow up to her visit here for a brain MR w/ and w/o contrast.  She'd recently had several allergic reactions, as well as hospitalizations for AFib, and was worried that having gadolinium might push her back into AFib.  She's been in NSR for only 16 days since having a chemical cardioversion (to which she apparently had a significant allergic reaction, also).  Varney Biles, the MR tech, and I discussed options with the patient, including having the MR at [facility], and she ultimately decided not to go through with the MR at this point.  I called her to tell her Varney Biles stated she had left two voicemail messages for Dr. Joylene Draft.  Ashlon said she would give that office a call, too, to convey her issues and concerns.  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  Gypsy Lore, RN

## 2018-06-14 ENCOUNTER — Other Ambulatory Visit: Payer: Self-pay | Admitting: Internal Medicine

## 2018-06-14 DIAGNOSIS — D333 Benign neoplasm of cranial nerves: Secondary | ICD-10-CM

## 2018-06-15 ENCOUNTER — Other Ambulatory Visit: Payer: Medicare Other | Admitting: *Deleted

## 2018-06-15 DIAGNOSIS — I10 Essential (primary) hypertension: Secondary | ICD-10-CM

## 2018-06-15 DIAGNOSIS — I4819 Other persistent atrial fibrillation: Secondary | ICD-10-CM

## 2018-06-15 DIAGNOSIS — M7989 Other specified soft tissue disorders: Secondary | ICD-10-CM

## 2018-06-16 LAB — CBC
HEMATOCRIT: 31.6 % — AB (ref 34.0–46.6)
Hemoglobin: 10.7 g/dL — ABNORMAL LOW (ref 11.1–15.9)
MCH: 28.7 pg (ref 26.6–33.0)
MCHC: 33.9 g/dL (ref 31.5–35.7)
MCV: 85 fL (ref 79–97)
Platelets: 407 10*3/uL (ref 150–450)
RBC: 3.73 x10E6/uL — AB (ref 3.77–5.28)
RDW: 15.6 % — ABNORMAL HIGH (ref 12.3–15.4)
WBC: 6.6 10*3/uL (ref 3.4–10.8)

## 2018-06-16 LAB — BASIC METABOLIC PANEL
BUN / CREAT RATIO: 20 (ref 12–28)
BUN: 17 mg/dL (ref 8–27)
CO2: 22 mmol/L (ref 20–29)
CREATININE: 0.85 mg/dL (ref 0.57–1.00)
Calcium: 9.2 mg/dL (ref 8.7–10.3)
Chloride: 94 mmol/L — ABNORMAL LOW (ref 96–106)
GFR calc Af Amer: 77 mL/min/{1.73_m2} (ref >59)
GFR, EST NON AFRICAN AMERICAN: 67 mL/min/{1.73_m2} (ref >59)
GLUCOSE: 124 mg/dL — AB (ref 65–99)
Potassium: 4.9 mmol/L (ref 3.5–5.2)
Sodium: 135 mmol/L (ref 134–144)

## 2018-07-12 ENCOUNTER — Ambulatory Visit
Admission: RE | Admit: 2018-07-12 | Discharge: 2018-07-12 | Disposition: A | Discharge status: Home / Self Care | Payer: Medicare Other | Source: Ambulatory Visit | Attending: Internal Medicine | Admitting: Internal Medicine

## 2018-07-12 DIAGNOSIS — D333 Benign neoplasm of cranial nerves: Secondary | ICD-10-CM

## 2018-07-20 ENCOUNTER — Ambulatory Visit: Payer: Medicare Other | Admitting: Physician Assistant

## 2018-07-20 ENCOUNTER — Other Ambulatory Visit (HOSPITAL_COMMUNITY): Payer: Self-pay | Admitting: Nurse Practitioner

## 2018-07-20 ENCOUNTER — Encounter: Payer: Self-pay | Admitting: Physician Assistant

## 2018-07-20 VITALS — BP 118/68 | HR 67 | Ht 73.0 in | Wt 198.0 lb

## 2018-07-20 DIAGNOSIS — I272 Pulmonary hypertension, unspecified: Secondary | ICD-10-CM | POA: Diagnosis not present

## 2018-07-20 DIAGNOSIS — I481 Persistent atrial fibrillation: Secondary | ICD-10-CM

## 2018-07-20 DIAGNOSIS — I5032 Chronic diastolic (congestive) heart failure: Secondary | ICD-10-CM | POA: Diagnosis not present

## 2018-07-20 DIAGNOSIS — I4819 Other persistent atrial fibrillation: Secondary | ICD-10-CM

## 2018-07-20 DIAGNOSIS — M7989 Other specified soft tissue disorders: Secondary | ICD-10-CM | POA: Diagnosis not present

## 2018-07-20 DIAGNOSIS — Z8673 Personal history of transient ischemic attack (TIA), and cerebral infarction without residual deficits: Secondary | ICD-10-CM

## 2018-07-20 MED ORDER — METOPROLOL SUCCINATE ER 100 MG PO TB24: 100.0000 mg | ORAL_TABLET | Freq: Two times a day (BID) | ORAL | 3 refills | Status: DC

## 2018-07-20 NOTE — Progress Notes (Signed)
Cardiology Office Note:    Date:  07/20/2018   ID:  Cynthia Combs, DOB [birth year], MRN [id number]  PCP:  Crist Infante, MD  Cardiologist:  Mertie Moores, MD   Electrophysiologist:  None   Referring MD: Crist Infante, MD   Chief Complaint  Patient presents with  . Follow-up    AFib, CHF     History of Present Illness:    Cynthia Combs is a 77 y.o. female with persistent atrial fibrillation, hypertension, hyperlipidemia, hypothyroidism, diastolic congestive heart failure.  She initially underwent successful cardioversion in 04/2018 but had return of atrial fibrillation.  She was admitted for dofetilide load but developed Torsades and was taken off of this drug.  It has been noted Flecainide could be considered in the future if necessary.  She was last seen in clinic in 05/2018.     Cynthia Combs returns for follow up on atrial fibrillation and congestive heart failure.  She is here alone.  She notes continued issues with lower extremity swelling.  She feels that is getting worse.  She also notes worsening shortness of breath.  She denies orthopnea, paroxysmal nocturnal dyspnea.  She denies chest discomfort or syncope.  She feels that her hands are swollen.  She recently had an MRI which was ordered by primary care.  This did demonstrate a recent left frontal stroke as well as a right cerebellar pontine meningioma.  Prior CV studies:   The following studies were reviewed today:  Echo 04/09/18 Mild LVH, EF 55-60, normal wall motion, mild MR, reduced RVSF, mild RAE, moderate TR, mild PI, PASP 55, small pericardial effusion  Echo 04/13/2017 Vigorous LVF, EF 65-70, LA upper limits of normal, mild RAE  Past Medical History:  Diagnosis Date  . Anemia   . Atrial fibrillation ([place])   . Cyst    HX of   . Hemorrhoids   . Hyperlipemia   . Hypertension   . Hyperthyroidism   . Thyroid nodule    Surgical Hx: The patient  has a past surgical history that includes Incise and drain abcess; Abdominal  hysterectomy; Appendectomy; Cholecystectomy; Abdominal exploration surgery; Colon surgery; Dilation and curettage of uterus; Knee surgery; and Cardioversion (N/A, 04/15/2018).   Current Medications: Current Meds  Medication Sig  . acetaminophen (TYLENOL) 500 MG tablet Take 1,000 mg by mouth every 6 (six) hours as needed for moderate pain or headache.   [name] apixaban (ELIQUIS) 5 MG TABS tablet Take 5 mg by mouth 2 (two) times daily.  . Ascorbic Acid (VITAMIN C) 1000 MG tablet Take 1,000 mg by mouth at bedtime.   . Calcium Carb-Cholecalciferol (CALCIUM 600-D PO) Take 2 tablets by mouth at bedtime.   . Cholecalciferol (VITAMIN D) 2000 units CAPS Take 4,000 Units by mouth at bedtime.   [name] estradiol (ESTRACE) 0.5 MG tablet Take 0.25 mg by mouth every Monday, Wednesday, and Friday.   . folic acid (FOLVITE) 481 MCG tablet Take 400 mcg by mouth daily.  . furosemide (LASIX) 40 MG tablet Take 1 tablet (40 mg total) by mouth daily.  [name] levothyroxine (SYNTHROID, LEVOTHROID) 75 MCG tablet Take 75 mcg by mouth daily before breakfast. DAW; PT TAKES NAME BRAND SYNTHROID  . losartan (COZAAR) 50 MG tablet Take 50 mg by mouth daily.  . Magnesium 250 MG TABS Take 1 tablet by mouth daily.   . simvastatin (ZOCOR) 20 MG tablet Take 20 mg by mouth at bedtime.   . [DISCONTINUED] diltiazem (CARDIZEM) 120 MG tablet Take 120 mg by mouth as directed.  daily  . [DISCONTINUED] metoprolol (TOPROL-XL) 200 MG 24 hr tablet Take 100 mg by mouth daily. PT TAKES WITH A 50 MG TABLET  . [DISCONTINUED] metoprolol succinate (TOPROL-XL) 50 MG 24 hr tablet Take 50 mg by mouth daily. Take one tablet (50 mg) by mouth with a 100 mg tablet for a total dose of 150 mg every morning  . [DISCONTINUED] potassium chloride SA (K-DUR,KLOR-CON) 20 MEQ tablet Take 1 tablet (20 mEq total) by mouth daily.     Allergies:   Crestor [rosuvastatin calcium]; Dofetilide; Lisinopril; Niacin and related; Other; Prevnar 13 [pneumococcal 13-val conj vacc]; and Singulair  [montelukast sodium]   Social History   Tobacco Use  . Smoking status: Former Research scientist (life sciences)  . Smokeless tobacco: Never Used  Substance Use Topics  . Alcohol use: No  . Drug use: No     Family Hx: The patient's family history includes Breast cancer in her maternal aunt; Colon polyps in her mother and sister; Heart disease in her mother; Kidney disease in her father. There is no history of Colon cancer.  ROS:   Please see the history of present illness.    Review of Systems  Constitution: Positive for malaise/fatigue.  Cardiovascular: Positive for leg swelling.  Respiratory: Positive for cough and shortness of breath.   Musculoskeletal: Positive for joint swelling and myalgias.  Neurological: Positive for loss of balance.   All other systems reviewed and are negative.   EKGs/Labs/Other Test Reviewed:    EKG:  EKG is  ordered today.  The ekg ordered today demonstrates normal sinus rhythm, heart rate 71, normal axis, PACs, QTC 439  Recent Labs: 04/08/2018: ALT 24; B Natriuretic Peptide 154.7 04/09/2018: TSH 1.413 05/20/2018: Magnesium 1.8 06/15/2018: BUN 17; Creatinine, Ser 0.85; Hemoglobin 10.7; Platelets 407; Potassium 4.9; Sodium 135   Recent Lipid Panel Lab Results  Component Value Date/Time   CHOL 94 04/09/2018 03:03 AM   TRIG 69 04/09/2018 03:03 AM   HDL 34 (L) 04/09/2018 03:03 AM   CHOLHDL 2.8 04/09/2018 03:03 AM   LDLCALC 46 04/09/2018 03:03 AM    Physical Exam:    VS:  BP 118/68   Pulse 67   Ht 6\' 1"  (1.854 m)   Wt 198 lb (89.8 kg)   SpO2 98%   BMI 26.12 kg/m     Wt Readings from Last 3 Encounters:  07/20/18 198 lb (89.8 kg)  06/07/18 199 lb (90.3 kg)  05/30/18 198 lb (89.8 kg)     Physical Exam  Constitutional: She is oriented to person, place, and time. She appears well-developed and well-nourished. No distress.  HENT:  Head: Normocephalic and atraumatic.  Eyes: No scleral icterus.  Neck: No JVD present. No thyromegaly present.  Cardiovascular: Normal  rate and regular rhythm.  No murmur heard. Pulmonary/Chest: Effort normal. She has no wheezes. She has no rales.  Abdominal: Soft. She exhibits no distension.  Musculoskeletal: She exhibits edema (1-2+ bilat LE edema).  Lymphadenopathy:    She has no cervical adenopathy.  Neurological: She is alert and oriented to person, place, and time.  Skin: Skin is warm and dry.  Psychiatric: She has a normal mood and affect.    ASSESSMENT & PLAN:    Leg swelling I suspect her lower extremity swelling is related to calcium channel blocker therapy in the setting of venous insufficiency and diastolic heart failure.  She has noted issues with leg swelling since she was placed on Cardizem.  I have recommended that we discontinue her Cardizem.  -DC  Cardizem  -Labs today: BMET, CBC, BNP  -Increase Lasix to 40 mg twice daily x3 days, then 40 mg daily  -Increase potassium to 20 mcg twice daily for 3 days, then 20 mg daily  -Use compression stockings when able  Persistent atrial fibrillation (HCC) Maintaining normal sinus rhythm.  As I am stopping her Cardizem, I will increase her metoprolol succinate to 100 mg twice daily.  Continue Apixaban.  Chronic diastolic CHF (congestive heart failure) ([place]) As noted, I believe that her swelling is more related to medication in the setting of venous insufficiency and diastolic heart failure.  Her weights have been stable.  I will obtain a BNP today.  If this is significantly elevated, I will keep her on higher dose Lasix for longer.  Pulmonary hypertension, unspecified ([place]) Eventually, she will need a follow-up echocardiogram to recheck her PASP.  History of stroke Her PCP wants to send her to neurology.  She plans to discuss this with her primary care provider soon.  I have encouraged her to go ahead and see neurology.  I am concerned that she had a stroke while on Eliquis.  She has not missed any doses.  I would defer to neurology, but she may need antiplatelet  therapy (aspirin) in addition to her Eliquis.   Dispo:  Return in about 6 weeks (around 08/31/2018) for Routine Follow Up, w/ Richardson Dopp, PA-C.   Medication Adjustments/Labs and Tests Ordered: Current medicines are reviewed at length with the patient today.  Concerns regarding medicines are outlined above.  Tests Ordered: Orders Placed This Encounter  Procedures  . Basic Metabolic Panel (BMET)  . CBC  . Pro b natriuretic peptide  . EKG 12-Lead   Medication Changes: Meds ordered this encounter  Medications  . metoprolol succinate (TOPROL-XL) 100 MG 24 hr tablet    Sig: Take 1 tablet (100 mg total) by mouth 2 (two) times daily. Take with or immediately following a meal.    Dispense:  180 tablet    Refill:  3    Signed, Richardson Dopp, PA-C  07/20/2018 5:49 PM    [facility] [address], [place], [place]  [address] Phone: [phone]; Fax: [phone]

## 2018-07-20 NOTE — Patient Instructions (Addendum)
Medication Instructions:  1. STOP CARDIZEM  2. INCREASE TOPROL XL TO 100 MG TWICE DAILY; NEW RX HAS BEEN SENT IN  3. INCREASE LASIX TO 40 MG TWICE DAILY FOR NO MORE THAN 3 DAYS; THEN RESUME CURRENT DOSING LASIX 40 MG DAILY  4. INCREASE POTASSIUM TO 20 MEQ TWICE DAILY FOR NO MORE THAN 3 DAYS; THEN RESUME CURRENT DOSING POTASSIUM 20 MEQ DAILY  Labwork: TODAY BMET, CBC, PRO BNP  Testing/Procedures: NONE ORDERED TODAY  Follow-Up: Richardson Dopp, [facility] ON 08/30/18 @ 2:45 PM   Any Other Special Instructions Will Be Listed Below (If Applicable). TRY TO START WEARING YOUR COMPRESSIONS STOCKINGS WHEN ABLE TOO    If you need a refill on your cardiac medications before your next appointment, please call your pharmacy.

## 2018-07-21 LAB — CBC
Hematocrit: 33.6 % — ABNORMAL LOW (ref 34.0–46.6)
Hemoglobin: 11.2 g/dL (ref 11.1–15.9)
MCH: 29 pg (ref 26.6–33.0)
MCHC: 33.3 g/dL (ref 31.5–35.7)
MCV: 87 fL (ref 79–97)
PLATELETS: 427 10*3/uL (ref 150–450)
RBC: 3.86 x10E6/uL (ref 3.77–5.28)
RDW: 14 % (ref 12.3–15.4)
WBC: 8.2 10*3/uL (ref 3.4–10.8)

## 2018-07-21 LAB — BASIC METABOLIC PANEL
BUN/Creatinine Ratio: 26 (ref 12–28)
BUN: 20 mg/dL (ref 8–27)
CO2: 26 mmol/L (ref 20–29)
Calcium: 9.4 mg/dL (ref 8.7–10.3)
Chloride: 94 mmol/L — ABNORMAL LOW (ref 96–106)
Creatinine, Ser: 0.77 mg/dL (ref 0.57–1.00)
GFR calc Af Amer: 87 mL/min/{1.73_m2} (ref >59)
GFR calc non Af Amer: 75 mL/min/{1.73_m2} (ref >59)
GLUCOSE: 95 mg/dL (ref 65–99)
POTASSIUM: 4.7 mmol/L (ref 3.5–5.2)
SODIUM: 136 mmol/L (ref 134–144)

## 2018-07-21 LAB — PRO B NATRIURETIC PEPTIDE: NT-PRO BNP: 844 pg/mL — AB (ref 0–738)

## 2018-08-30 ENCOUNTER — Encounter: Payer: Self-pay | Admitting: Physician Assistant

## 2018-08-30 ENCOUNTER — Ambulatory Visit: Payer: Medicare Other | Admitting: Physician Assistant

## 2018-08-30 VITALS — BP 118/70 | HR 121 | Ht 73.0 in | Wt 195.4 lb

## 2018-08-30 DIAGNOSIS — I10 Essential (primary) hypertension: Secondary | ICD-10-CM | POA: Diagnosis not present

## 2018-08-30 DIAGNOSIS — I4891 Unspecified atrial fibrillation: Secondary | ICD-10-CM

## 2018-08-30 DIAGNOSIS — Z8673 Personal history of transient ischemic attack (TIA), and cerebral infarction without residual deficits: Secondary | ICD-10-CM | POA: Diagnosis not present

## 2018-08-30 DIAGNOSIS — I5032 Chronic diastolic (congestive) heart failure: Secondary | ICD-10-CM

## 2018-08-30 NOTE — Progress Notes (Addendum)
Cardiology Office Note:    Date:  08/30/2018   ID:  Cynthia Combs, DOB 07/06/1941, MRN [id number]  PCP:  Crist Infante, MD  Cardiologist:  Mertie Moores, MD  Electrophysiologist:  None   Referring MD: Crist Infante, MD   Chief Complaint  Patient presents with  . Follow-up    AFib, CHF    History of Present Illness:    Cynthia Combs is a 77 y.o. female with persistent atrial fibrillation, hypertension, hyperlipidemia, hypothyroidism, diastolic congestive heart failure, prior stroke (noted on MRI).  She initially underwent successful cardioversion in 04/2018 but had return of atrial fibrillation.  She was admitted for dofetilide load but developed Torsades and was taken off of this drug.  It has been noted Flecainide could be considered in the future if necessary.  She was last seen 07/20/2018.  I stopped her diltiazem due to concerns over her swelling being worsened by calcium channel blocker therapy.  Cynthia Combs returns for follow-up.  She is here alone.  She recently was placed on prednisone for joint swelling.  She has also seen rheumatology and it sounds like she may have a diagnosis of rheumatoid arthritis.  She has not been seen by neurology yet.  Since stopping diltiazem, her lower extremity swelling has improved.  She started feeling fatigue and palpitations several days ago and knew she was back in atrial fibrillation.  She denies chest discomfort.  She denies any bleeding issues.  She has not missed any doses of Apixaban.  Prior CV studies:   The following studies were reviewed today:  Brain MRI 9/3/319 IMPRESSION: 1. Punctate focus of acute ischemia in the left frontal white matter without hemorrhage or mass effect. 2. 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  Echo 04/09/18 Mild LVH, EF 55-60, normal wall motion, mild MR, reduced RVSF, mild RAE, moderate TR, mild PI, PASP 55, small pericardial  effusion  Echo 04/13/2017 Vigorous LVF, EF 65-70, LA upper limits of normal, mild RAE  Past Medical History:  Diagnosis Date  . Anemia   . Atrial fibrillation ([place])   . Cyst    HX of   . Hemorrhoids   . Hyperlipemia   . Hypertension   . Hyperthyroidism   . Thyroid nodule    Surgical Hx: The patient  has a past surgical history that includes Incise and drain abcess; Abdominal hysterectomy; Appendectomy; Cholecystectomy; Abdominal exploration surgery; Colon surgery; Dilation and curettage of uterus; Knee surgery; and Cardioversion (N/A, 04/15/2018).   Current Medications: Current Meds  Medication Sig  . acetaminophen (TYLENOL) 500 MG tablet Take 1,000 mg by mouth every 6 (six) hours as needed for moderate pain or headache.   [name] apixaban (ELIQUIS) 5 MG TABS tablet Take 5 mg by mouth 2 (two) times daily.  . Ascorbic Acid (VITAMIN C) 1000 MG tablet Take 1,000 mg by mouth at bedtime.   . Calcium Carb-Cholecalciferol (CALCIUM 600-D PO) Take 2 tablets by mouth at bedtime.   . Cholecalciferol (VITAMIN D) 2000 units CAPS Take 4,000 Units by mouth at bedtime.   [name] estradiol (ESTRACE) 0.5 MG tablet Take 0.25 mg by mouth every Monday, Wednesday, and Friday.   . folic acid (FOLVITE) 093 MCG tablet Take 400 mcg by mouth daily.  . furosemide (LASIX) 40 MG tablet Take 1 tablet (40 mg total) by mouth daily.  [name] KLOR-CON M20 20 MEQ tablet TAKE 1 TABLET BY MOUTH EVERY DAY  . levothyroxine (SYNTHROID, LEVOTHROID) 75 MCG tablet Take 75  mcg by mouth daily before breakfast. DAW; PT TAKES NAME BRAND SYNTHROID  . losartan (COZAAR) 50 MG tablet Take 50 mg by mouth daily.  . Magnesium 250 MG TABS Take 1 tablet by mouth daily.   . metoprolol succinate (TOPROL-XL) 100 MG 24 hr tablet Take 1 tablet (100 mg total) by mouth 2 (two) times daily. Take with or immediately following a meal.  . pantoprazole (PROTONIX) 40 MG tablet Take 40 mg by mouth daily.  . predniSONE (DELTASONE) 10 MG tablet Take 10 mg by mouth daily.   . simvastatin (ZOCOR) 20 MG tablet Take 20 mg by mouth at bedtime.   . traMADol (ULTRAM) 50 MG tablet Take 1 tablet by mouth every 8 (eight) hours as needed. for pain     Allergies:   Crestor [rosuvastatin calcium]; Dofetilide; Lisinopril; Niacin and related; Other; Prevnar 13 [pneumococcal 13-val conj vacc]; and Singulair [montelukast sodium]   Social History   Tobacco Use  . Smoking status: Former Research scientist (life sciences)  . Smokeless tobacco: Never Used  Substance Use Topics  . Alcohol use: No  . Drug use: No     Family Hx: The patient's family history includes Breast cancer in her maternal aunt; Colon polyps in her mother and sister; Heart disease in her mother; Kidney disease in her father. There is no history of Colon cancer.  ROS:   Please see the history of present illness.    Review of Systems  Cardiovascular: Positive for chest pain and irregular heartbeat.  Respiratory: Positive for shortness of breath.   Gastrointestinal: Positive for nausea.  Neurological: Positive for dizziness.   All other systems reviewed and are negative.   EKGs/Labs/Other Test Reviewed:    EKG:  EKG is  ordered today.  The ekg ordered today demonstrates atrial fibrillation with rapid ventricular rate, HR 121  Recent Labs: 04/08/2018: ALT 24; B Natriuretic Peptide 154.7 04/09/2018: TSH 1.413 05/20/2018: Magnesium 1.8 07/20/2018: BUN 20; Creatinine, Ser 0.77; Hemoglobin 11.2; NT-Pro BNP 844; Platelets 427; Potassium 4.7; Sodium 136   Recent Lipid Panel Lab Results  Component Value Date/Time   CHOL 94 04/09/2018 03:03 AM   TRIG 69 04/09/2018 03:03 AM   HDL 34 (L) 04/09/2018 03:03 AM   CHOLHDL 2.8 04/09/2018 03:03 AM   LDLCALC 46 04/09/2018 03:03 AM   Labs from PCP 07/2018: Creatinine 0.8, potassium 4.8, ALT 11, hemoglobin 10.8, TSH 1.62, total cholesterol 124, triglycerides 118, LDL 56, HDL 44  Physical Exam:    VS:  BP 118/70   Pulse (!) 121   Ht 6\' 1"  (1.854 m)   Wt 195 lb 6.4 oz (88.6 kg)   BMI  25.78 kg/m     Wt Readings from Last 3 Encounters:  08/30/18 195 lb 6.4 oz (88.6 kg)  07/20/18 198 lb (89.8 kg)  06/07/18 199 lb (90.3 kg)     Physical Exam  Constitutional: She is oriented to person, place, and time. She appears well-developed and well-nourished. No distress.  HENT:  Head: Normocephalic and atraumatic.  Eyes: No scleral icterus.  Neck: No JVD present. No thyromegaly present.  Cardiovascular: An irregularly irregular rhythm present. Tachycardia present.  No murmur heard. Pulmonary/Chest: Effort normal. She has no rales.  Abdominal: Soft. She exhibits no distension.  Musculoskeletal: She exhibits no edema.  Lymphadenopathy:    She has no cervical adenopathy.  Neurological: She is alert and oriented to person, place, and time.  Skin: Skin is warm and dry.  Psychiatric: She has a normal mood and affect.  ASSESSMENT & PLAN:    Atrial fibrillation with RVR ([place])  She has developed recurrent atrial fibrillation with rapid ventricular rate.  She is somewhat symptomatic with this.  I do not think her blood pressure will tolerate an increase in her beta-blocker.  Of note, she developed Torsades with Dofetilide in June 2019.  I have recommended proceeding with cardioversion to restore normal sinus rhythm.  I discussed her case today with Dr. Curt Bears (attending MD) who agreed.  He also suggested that we go ahead and refer her to neurology for her history of stroke noted on MRI.  Since she has not missed any doses of Apixaban, he did not feel that we needed to pursue TEE.  He also suggested that she follow-up with him to discuss rhythm control (alternate arrhythmic drug versus PVI ablation).  -Continue Apixaban 5 mg bid  -Continue current dose of metoprolol succinate  -Arrange cardioversion in the next 24 to 48 hours  -Go to the emergency room if she feels worse  Chronic diastolic CHF (congestive heart failure) ([place]) Volume currently stable.  Continue current  therapy.  Essential hypertension The patient's blood pressure is controlled on her current regimen.  Continue current therapy.   History of stroke Refer to neurology as noted.     Dispo:  Return for evaluation with Dr. Curt Bears after cardioversion.   Medication Adjustments/Labs and Tests Ordered: Current medicines are reviewed at length with the patient today.  Concerns regarding medicines are outlined above.  Tests Ordered: Orders Placed This Encounter  Procedures  . Ambulatory referral to Neurology  . Ambulatory referral to Cardiac Electrophysiology  . EKG 12-Lead   Medication Changes: No orders of the defined types were placed in this encounter.   Signed, Richardson Dopp, PA-C  08/30/2018 4:48 PM    [facility] [place], [place], [place]  [address] Phone: [phone]; Fax: [phone]

## 2018-08-30 NOTE — Patient Instructions (Signed)
Medication Instructions:  1. Your physician recommends that you continue on your current medications as directed. Please refer to the Current Medication list given to you today.  If you need a refill on your cardiac medications before your next appointment, please call your pharmacy.   Lab work: NONE ORDERED TODAY If you have labs (blood work) drawn today and your tests are completely normal, you will receive your results only by: [name] MyChart Message (if you have MyChart) OR . A paper copy in the mail If you have any lab test that is abnormal or we need to change your treatment, we will call you to review the results.  Testing/Procedures: Your physician has recommended that you have a Cardioversion (DCCV). Electrical Cardioversion uses a jolt of electricity to your heart either through paddles or wired patches attached to your chest. This is a controlled, usually prescheduled, procedure. Defibrillation is done under light anesthesia in the hospital, and you usually go home the day of the procedure. This is done to get your heart back into a normal rhythm. You are not awake for the procedure. Please see the instruction sheet given to you today.    Follow-Up: At [facility], you and your health needs are our priority.  As part of our continuing mission to provide you with exceptional heart care, we have created designated Provider Care Teams.  These Care Teams include your primary Cardiologist (physician) and Advanced Practice Providers (APPs -  Physician Assistants and Nurse Practitioners) who all work together to provide you with the care you need, when you need it. You will need a follow up appointment in 2 weeks.  Please call our office 2 months in advance to schedule this appointment.  You may see None DR. CAMNITZ IN 1-2 WEEKS POST CARDIOVERSION or one of the following Advanced Practice Providers on your designated Care Team:   Chanetta Marshall, NP . Tommye Standard, PA-C  YOU ARE BEING REFERRED  TO [facility]  Any Other Special Instructions Will Be Listed Below (If Applicable).  CARDIOVERSION INSTRUCTIONS:  Dear Cynthia Combs (patient name) You are scheduled for a Cardioversion on 09/01/18 with Dr. Stanford Breed AT 2:15 PM.  Please arrive at the [facility] (Main Entrance A) at [facility]: [name], [name] [address] at 1:15 PM.  (1 hour prior to procedure unless lab work is needed; if lab work is needed arrive 1.5 hours ahead)  DIET: Nothing to eat or drink after midnight except a sip of water with medications (see medication instructions below)  Medication Instructions:  Continue your anticoagulant: ELIQUIS You will need to continue your anticoagulant after your procedure until     you  are told by your  Provider that it is safe to stop   Labs: If patient is on Coumadin, patient needs pt/INR, CBC, BMET within 3 days (No pt/INR needed for patients taking Xarelto, Eliquis, Pradaxa) For patients receiving anesthesia for TEE and all Cardioversion patients: BMET, CBC within 1 week  Come to: NO LABS WERE ORDERED TODAY (Lab option #1) Come to the lab at [place] between the hours of 8:00 am and 4:30 pm. You do not have to be fasting. (Lab option #2) Lab at an alternate location (Lab option #3) your lab work will be done at the hospital prior to your procedure - you will need to arrive 1  hours ahead of your procedure  You must have a responsible person to drive you home and stay in the waiting  area during your procedure. Failure to do so could result in cancellation.  Bring your insurance cards.  *Special Note: Every effort is made to have your procedure done on time. Occasionally there are emergencies that occur at the hospital that may cause delays. Please be patient if a delay does occur.

## 2018-09-01 ENCOUNTER — Ambulatory Visit (HOSPITAL_COMMUNITY): Payer: Medicare Other | Admitting: Anesthesiology

## 2018-09-01 ENCOUNTER — Encounter (HOSPITAL_COMMUNITY)
Admission: RE | Disposition: A | Discharge status: Home / Self Care | Payer: Self-pay | Source: Ambulatory Visit | Attending: Cardiology

## 2018-09-01 ENCOUNTER — Other Ambulatory Visit: Payer: Self-pay

## 2018-09-01 ENCOUNTER — Encounter (HOSPITAL_COMMUNITY): Payer: Self-pay | Admitting: Certified Registered Nurse Anesthetist

## 2018-09-01 ENCOUNTER — Ambulatory Visit (HOSPITAL_COMMUNITY)
Admission: RE | Admit: 2018-09-01 | Discharge: 2018-09-01 | Disposition: A | Discharge status: Home / Self Care | Payer: Medicare Other | Source: Ambulatory Visit | Attending: Cardiology | Admitting: Cardiology

## 2018-09-01 DIAGNOSIS — I4819 Other persistent atrial fibrillation: Secondary | ICD-10-CM | POA: Diagnosis present

## 2018-09-01 DIAGNOSIS — I110 Hypertensive heart disease with heart failure: Secondary | ICD-10-CM | POA: Diagnosis not present

## 2018-09-01 DIAGNOSIS — Z7901 Long term (current) use of anticoagulants: Secondary | ICD-10-CM | POA: Diagnosis not present

## 2018-09-01 DIAGNOSIS — Z87891 Personal history of nicotine dependence: Secondary | ICD-10-CM | POA: Insufficient documentation

## 2018-09-01 DIAGNOSIS — E039 Hypothyroidism, unspecified: Secondary | ICD-10-CM | POA: Insufficient documentation

## 2018-09-01 DIAGNOSIS — Z8673 Personal history of transient ischemic attack (TIA), and cerebral infarction without residual deficits: Secondary | ICD-10-CM | POA: Insufficient documentation

## 2018-09-01 DIAGNOSIS — E785 Hyperlipidemia, unspecified: Secondary | ICD-10-CM | POA: Insufficient documentation

## 2018-09-01 DIAGNOSIS — Z8249 Family history of ischemic heart disease and other diseases of the circulatory system: Secondary | ICD-10-CM | POA: Insufficient documentation

## 2018-09-01 DIAGNOSIS — I5032 Chronic diastolic (congestive) heart failure: Secondary | ICD-10-CM | POA: Insufficient documentation

## 2018-09-01 HISTORY — PX: CARDIOVERSION: SHX1299

## 2018-09-01 LAB — POCT I-STAT EG7
Acid-Base Excess: 5 mmol/L — ABNORMAL HIGH (ref 0.0–2.0)
BICARBONATE: 31.7 mmol/L — AB (ref 20.0–28.0)
CALCIUM ION: 1.2 mmol/L (ref 1.15–1.40)
HCT: 39 % (ref 36.0–46.0)
Hemoglobin: 13.3 g/dL (ref 12.0–15.0)
O2 SAT: 50 %
PH VEN: 7.408 (ref 7.250–7.430)
PO2 VEN: 26 mmHg — AB (ref 32.0–45.0)
Patient temperature: 36
Potassium: 4.1 mmol/L (ref 3.5–5.1)
Sodium: 135 mmol/L (ref 135–145)
TCO2: 33 mmol/L — AB (ref 22–32)
pCO2, Ven: 49.7 mmHg (ref 44.0–60.0)

## 2018-09-01 SURGERY — CARDIOVERSION
Anesthesia: General

## 2018-09-01 MED ORDER — SODIUM CHLORIDE 0.9 % IV SOLN
INTRAVENOUS | Status: DC | PRN
  Administered 2018-09-01: 14:00:00 via INTRAVENOUS

## 2018-09-01 MED ORDER — LIDOCAINE 2% (20 MG/ML) 5 ML SYRINGE
INTRAMUSCULAR | Status: DC | PRN
  Administered 2018-09-01: 100 mg via INTRAVENOUS

## 2018-09-01 MED ORDER — PROPOFOL 10 MG/ML IV BOLUS
INTRAVENOUS | Status: DC | PRN
  Administered 2018-09-01: 100 mg via INTRAVENOUS

## 2018-09-01 NOTE — Anesthesia Preprocedure Evaluation (Signed)
Anesthesia Evaluation  Patient identified by MRN, date of birth, ID band Patient awake    Reviewed: Allergy & Precautions, NPO status , Patient's Chart, lab work & pertinent test results  Airway Mallampati: II  TM Distance: >3 FB Neck ROM: Full    Dental no notable dental hx. (+) Teeth Intact   Pulmonary former smoker,    Pulmonary exam normal breath sounds clear to auscultation       Cardiovascular hypertension, +CHF  Normal cardiovascular exam+ dysrhythmias Atrial Fibrillation  Rhythm:Regular Rate:Normal  ECHO 04/09/18 Left ventricle: The cavity size was normal. Wall thickness was   increased in a pattern of mild LVH. Systolic function was normal.   The estimated ejection fraction was in the range of 55% to 60%.   Wall motion was normal; there were no regional wall motion   abnormalities. The study was not technically sufficient to allow   evaluation of LV diastolic dysfunction due to atrial   fibrillation.   Neuro/Psych negative neurological ROS  negative psych ROS   GI/Hepatic negative GI ROS, Neg liver ROS,   Endo/Other  Hypothyroidism   Renal/GU negative Renal ROS     Musculoskeletal negative musculoskeletal ROS (+)   Abdominal   Peds  Hematology  (+) anemia ,   Anesthesia Other Findings   Reproductive/Obstetrics                             Anesthesia Physical Anesthesia Plan  ASA: III  Anesthesia Plan: General   Post-op Pain Management:    Induction: Intravenous  PONV Risk Score and Plan: Treatment may vary due to age or medical condition  Airway Management Planned: Nasal Cannula and Natural Airway  Additional Equipment:   Intra-op Plan:   Post-operative Plan:   Informed Consent: I have reviewed the patients History and Physical, chart, labs and discussed the procedure including the risks, benefits and alternatives for the proposed anesthesia with the patient or  authorized representative who has indicated his/her understanding and acceptance.   Dental advisory given  Plan Discussed with:   Anesthesia Plan Comments:         Anesthesia Quick Evaluation

## 2018-09-01 NOTE — Procedures (Signed)
Electrical Cardioversion Procedure Note Cynthia Combs [id number] 07-12-1941  Procedure: Electrical Cardioversion Indications:  Atrial Fibrillation  Procedure Details Consent: Risks of procedure as well as the alternatives and risks of each were explained to the (patient/caregiver).  Consent for procedure obtained. Time Out: Verified patient identification, verified procedure, site/side was marked, verified correct patient position, special equipment/implants available, medications/allergies/relevent history reviewed, required imaging and test results available.  Performed  Patient placed on cardiac monitor, pulse oximetry, supplemental oxygen as necessary.  Sedation given: Pt sedated by anesthesia with lidocaine 100 mg and diprovan 100 mg IV. Pacer pads placed anterior and posterior chest.  Cardioverted 1 time(s).  Cardioverted at 120J.  Evaluation Findings: Post procedure EKG shows: NSR Complications: None Patient did tolerate procedure well.   Kirk Ruths 09/01/2018, 2:08 PM

## 2018-09-01 NOTE — Discharge Instructions (Signed)
Electrical Cardioversion, Care After °This sheet gives you information about how to care for yourself after your procedure. Your health care provider may also give you more specific instructions. If you have problems or questions, contact your health care provider. °What can I expect after the procedure? °After the procedure, it is common to have: °· Some redness on the skin where the shocks were given. ° °Follow these instructions at home: °· Do not drive for 24 hours if you were given a medicine to help you relax (sedative). °· Take over-the-counter and prescription medicines only as told by your health care provider. °· Ask your health care provider how to check your pulse. Check it often. °· Rest for 48 hours after the procedure or as told by your health care provider. °· Avoid or limit your caffeine use as told by your health care provider. °Contact a health care provider if: °· You feel like your heart is beating too quickly or your pulse is not regular. °· You have a serious muscle cramp that does not go away. °Get help right away if: °· You have discomfort in your chest. °· You are dizzy or you feel faint. °· You have trouble breathing or you are short of breath. °· Your speech is slurred. °· You have trouble moving an arm or leg on one side of your body. °· Your fingers or toes turn cold or blue. °This information is not intended to replace advice given to you by your health care provider. Make sure you discuss any questions you have with your health care provider. °Document Released: 08/16/2013 Document Revised: 05/29/2016 Document Reviewed: 05/01/2016 °Elsevier Interactive Patient Education © 2018 [employer] ° °

## 2018-09-01 NOTE — Anesthesia Postprocedure Evaluation (Signed)
Anesthesia Post Note  Patient: Cynthia Combs  Procedure(s) Performed: CARDIOVERSION (N/A )     Patient location during evaluation: Endoscopy Anesthesia Type: General Level of consciousness: awake and alert Pain management: pain level controlled Vital Signs Assessment: post-procedure vital signs reviewed and stable Respiratory status: spontaneous breathing, nonlabored ventilation, respiratory function stable and patient connected to nasal cannula oxygen Cardiovascular status: blood pressure returned to baseline and stable Postop Assessment: no apparent nausea or vomiting Anesthetic complications: no    Last Vitals:  Vitals:   09/01/18 1337 09/01/18 1410  BP: (!) 142/80 135/60  Pulse: (!) 115 68  Resp: 19   Temp: 36.6 C 36.6 C  SpO2: 100% 100%    Last Pain:  Vitals:   09/01/18 1410  TempSrc: Axillary  PainSc: 0-No pain                 Barnet Glasgow

## 2018-09-01 NOTE — Transfer of Care (Signed)
Immediate Anesthesia Transfer of Care Note  Patient: Cynthia Combs  Procedure(s) Performed: CARDIOVERSION (N/A )  Patient Location: PACU and Endoscopy Unit  Anesthesia Type:General  Level of Consciousness: awake and patient cooperative  Airway & Oxygen Therapy: Patient Spontanous Breathing and Patient connected to nasal cannula oxygen  Post-op Assessment: Report given to RN, Post -op Vital signs reviewed and stable and Patient moving all extremities  Post vital signs: Reviewed and stable  Last Vitals:  Vitals Value Taken Time  BP    Temp    Pulse    Resp    SpO2      Last Pain:  Vitals:   09/01/18 1337  TempSrc: Oral  PainSc: 0-No pain         Complications: No apparent anesthesia complications

## 2018-09-01 NOTE — H&P (Signed)
Office Visit   08/30/2018 [facility] [address]    [place], [place], [place]  Cardiology   Atrial fibrillation with RVR ([place]) +3 more  Dx   Follow-up   ; Referred by Crist Infante, MD  Reason for Visit   Additional Documentation   Vitals:   BP 118/70   Pulse 121    Ht 6\' 1"  (1.854 m)   Wt 88.6 kg   BMI 25.78 kg/m   BSA 2.14 m     More Vitals   Flowsheets:   Anthropometrics,   MEWS Score     Encounter Info:   Billing Info,   History,   Allergies,   Detailed Report     All Notes   Procedures by Liliane Shi, PA-C at 08/30/2018 2:45 PM  Author: Liliane Shi, PA-C Author Type: Physician Assistant Filed: 08/31/2018 11:27 AM  Note Status: Signed Cosign: Cosign Not Required Encounter Date: 08/30/2018  Editor: Sallee Provencal L  Procedure Orders:  1. EKG 12-Lead [[id number]] ordered by Liliane Shi, PA-C at 08/30/18 1542         Scan on 08/31/2018 11:27 AM by Sallee Provencal L: Ekg - [facility]   Progress Notes by Liliane Shi, PA-C at 08/30/2018 2:45 PM  Author: Liliane Shi, PA-C Author Type: Physician Assistant Filed: 08/30/2018 4:49 PM  Note Status: Addendum Cosign: Cosigned by Constance Haw, MD at 08/30/2018 4:52 PM Encounter Date: 08/30/2018  Editor: Sharmon Revere (Physician Assistant)  Prior Versions: 1. Sharmon Revere (Physician Assistant) at 08/30/2018 4:48 PM - Mickle Mallory Needed  Expand All Collapse All    Cardiology Office Note:    Date:  08/30/2018   ID:  Cynthia Combs, DOB 03/14/1941, MRN [id number]  PCP:  Crist Infante, MD           Cardiologist:  Mertie Moores, MD  Electrophysiologist:  None   Referring MD: Crist Infante, MD       Chief Complaint  Patient presents with  . Follow-up    AFib, CHF    History of Present Illness:    Cynthia Combs is a 77 y.o. female with persistentatrial fibrillation,hypertension,hyperlipidemia, hypothyroidism, diastoliccongestive heart failure,  prior stroke (noted on MRI). She initially underwent successful cardioversion in 04/2018 but had return of atrial fibrillation. She was admitted for dofetilide load but developed Torsades and was taken off of this drug. It has been noted Flecainide could be considered in the future if necessary.  She was last seen 07/20/2018.  I stopped her diltiazem due to concerns over her swelling being worsened by calcium channel blocker therapy.  Ms. Mcgaughy returns for follow-up.  She is here alone.  She recently was placed on prednisone for joint swelling.  She has also seen rheumatology and it sounds like she may have a diagnosis of rheumatoid arthritis.  She has not been seen by neurology yet.  Since stopping diltiazem, her lower extremity swelling has improved.  She started feeling fatigue and palpitations several days ago and knew she was back in atrial fibrillation.  She denies chest discomfort.  She denies any bleeding issues.  She has not missed any doses of Apixaban.  Prior CV studies:   The following studies were reviewed today:  Brain MRI 9/3/319 IMPRESSION: 1. Punctate focus of acute ischemia in the left frontal white matter without hemorrhage or mass effect. 2. 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  Echo 04/09/18 Mild LVH,  EF 55-60, normal wall motion, mild MR, reduced RVSF, mild RAE, moderate TR, mild PI, PASP 55, small pericardial effusion  Echo 04/13/2017 Vigorous LVF, EF 65-70, LA upper limits of normal, mild RAE      Past Medical History:  Diagnosis Date  . Anemia   . Atrial fibrillation ([place])   . Cyst    HX of   . Hemorrhoids   . Hyperlipemia   . Hypertension   . Hyperthyroidism   . Thyroid nodule    Surgical Hx: The patient  has a past surgical history that includes Incise and drain abcess; Abdominal hysterectomy; Appendectomy; Cholecystectomy; Abdominal exploration surgery; Colon surgery;  Dilation and curettage of uterus; Knee surgery; and Cardioversion (N/A, 04/15/2018).   Current Medications: Active Medications      Current Meds  Medication Sig  . acetaminophen (TYLENOL) 500 MG tablet Take 1,000 mg by mouth every 6 (six) hours as needed for moderate pain or headache.   [name] apixaban (ELIQUIS) 5 MG TABS tablet Take 5 mg by mouth 2 (two) times daily.  . Ascorbic Acid (VITAMIN C) 1000 MG tablet Take 1,000 mg by mouth at bedtime.   . Calcium Carb-Cholecalciferol (CALCIUM 600-D PO) Take 2 tablets by mouth at bedtime.   . Cholecalciferol (VITAMIN D) 2000 units CAPS Take 4,000 Units by mouth at bedtime.   [name] estradiol (ESTRACE) 0.5 MG tablet Take 0.25 mg by mouth every Monday, Wednesday, and Friday.   . folic acid (FOLVITE) 275 MCG tablet Take 400 mcg by mouth daily.  . furosemide (LASIX) 40 MG tablet Take 1 tablet (40 mg total) by mouth daily.  [name] KLOR-CON M20 20 MEQ tablet TAKE 1 TABLET BY MOUTH EVERY DAY  . levothyroxine (SYNTHROID, LEVOTHROID) 75 MCG tablet Take 75 mcg by mouth daily before breakfast. DAW; PT TAKES NAME BRAND SYNTHROID  . losartan (COZAAR) 50 MG tablet Take 50 mg by mouth daily.  . Magnesium 250 MG TABS Take 1 tablet by mouth daily.   . metoprolol succinate (TOPROL-XL) 100 MG 24 hr tablet Take 1 tablet (100 mg total) by mouth 2 (two) times daily. Take with or immediately following a meal.  . pantoprazole (PROTONIX) 40 MG tablet Take 40 mg by mouth daily.  . predniSONE (DELTASONE) 10 MG tablet Take 10 mg by mouth daily.  . simvastatin (ZOCOR) 20 MG tablet Take 20 mg by mouth at bedtime.   . traMADol (ULTRAM) 50 MG tablet Take 1 tablet by mouth every 8 (eight) hours as needed. for pain       Allergies:   Crestor [rosuvastatin calcium]; Dofetilide; Lisinopril; Niacin and related; Other; Prevnar 13 [pneumococcal 13-val conj vacc]; and Singulair [montelukast sodium]   Social History       Tobacco Use  . Smoking status: Former Research scientist (life sciences)  . Smokeless tobacco:  Never Used  Substance Use Topics  . Alcohol use: No  . Drug use: No     Family Hx: The patient's family history includes Breast cancer in her maternal aunt; Colon polyps in her mother and sister; Heart disease in her mother; Kidney disease in her father. There is no history of Colon cancer.  ROS:   Please see the history of present illness.    Review of Systems  Cardiovascular: Positive for chest pain and irregular heartbeat.  Respiratory: Positive for shortness of breath.   Gastrointestinal: Positive for nausea.  Neurological: Positive for dizziness.   All other systems reviewed and are negative.   EKGs/Labs/Other Test Reviewed:    EKG:  EKG is  ordered today.  The ekg ordered today demonstrates atrial fibrillation with rapid ventricular rate, HR 121  Recent Labs: 04/08/2018: ALT 24; B Natriuretic Peptide 154.7 04/09/2018: TSH 1.413 05/20/2018: Magnesium 1.8 07/20/2018: BUN 20; Creatinine, Ser 0.77; Hemoglobin 11.2; NT-Pro BNP 844; Platelets 427; Potassium 4.7; Sodium 136   Recent Lipid Panel Labs (Brief)       Lab Results  Component Value Date/Time   CHOL 94 04/09/2018 03:03 AM   TRIG 69 04/09/2018 03:03 AM   HDL 34 (L) 04/09/2018 03:03 AM   CHOLHDL 2.8 04/09/2018 03:03 AM   LDLCALC 46 04/09/2018 03:03 AM     Labs from PCP 07/2018: Creatinine 0.8, potassium 4.8, ALT 11, hemoglobin 10.8, TSH 1.62, total cholesterol 124, triglycerides 118, LDL 56, HDL 44  Physical Exam:    VS:  BP 118/70   Pulse (!) 121   Ht 6\' 1"  (1.854 m)   Wt 195 lb 6.4 oz (88.6 kg)   BMI 25.78 kg/m        Wt Readings from Last 3 Encounters:  08/30/18 195 lb 6.4 oz (88.6 kg)  07/20/18 198 lb (89.8 kg)  06/07/18 199 lb (90.3 kg)     Physical Exam  Constitutional: She is oriented to person, place, and time. She appears well-developed and well-nourished. No distress.  HENT:  Head: Normocephalic and atraumatic.  Eyes: No scleral icterus.  Neck: No JVD present. No thyromegaly  present.  Cardiovascular: An irregularly irregular rhythm present. Tachycardia present.  No murmur heard. Pulmonary/Chest: Effort normal. She has no rales.  Abdominal: Soft. She exhibits no distension.  Musculoskeletal: She exhibits no edema.  Lymphadenopathy:    She has no cervical adenopathy.  Neurological: She is alert and oriented to person, place, and time.  Skin: Skin is warm and dry.  Psychiatric: She has a normal mood and affect.    ASSESSMENT & PLAN:    Atrial fibrillation with RVR ([place])  She has developed recurrent atrial fibrillation with rapid ventricular rate.  She is somewhat symptomatic with this.  I do not think her blood pressure will tolerate an increase in her beta-blocker.  Of note, she developed Torsades with Dofetilide in June 2019.  I have recommended proceeding with cardioversion to restore normal sinus rhythm.  I discussed her case today with Dr. Curt Bears (attending MD) who agreed.  He also suggested that we go ahead and refer her to neurology for her history of stroke noted on MRI.  Since she has not missed any doses of Apixaban, he did not feel that we needed to pursue TEE.  He also suggested that she follow-up with him to discuss rhythm control (alternate arrhythmic drug versus PVI ablation).             -Continue Apixaban 5 mg bid             -Continue current dose of metoprolol succinate             -Arrange cardioversion in the next 24 to 48 hours             -Go to the emergency room if she feels worse  Chronic diastolic CHF (congestive heart failure) ([place]) Volume currently stable.  Continue current therapy.  Essential hypertension The patient's blood pressure is controlled on her current regimen.  Continue current therapy.   History of stroke Refer to neurology as noted.     Dispo:  Return for evaluation with Dr. Curt Bears after cardioversion.   Medication Adjustments/Labs and Tests  Ordered: Current medicines are reviewed at length with the  patient today.  Concerns regarding medicines are outlined above.  Tests Ordered:    Orders Placed This Encounter  Procedures  . Ambulatory referral to Neurology  . Ambulatory referral to Cardiac Electrophysiology  . EKG 12-Lead   Medication Changes: No orders of the defined types were placed in this encounter.   Signed, Richardson Dopp, PA-C  08/30/2018 4:48 PM    [facility] [place], [place], [place]  [address] Phone: [phone]; Fax: [phone]      Pt compliant with apixaban; no changes; for DCCV. Kirk Ruths

## 2018-09-01 NOTE — Interval H&P Note (Signed)
History and Physical Interval Note:  09/01/2018 2:07 PM  Cynthia Combs  has presented today for surgery, with the diagnosis of ATRIAL FIBRILLATION  The various methods of treatment have been discussed with the patient and family. After consideration of risks, benefits and other options for treatment, the patient has consented to  Procedure(s): CARDIOVERSION (N/A) as a surgical intervention .  The patient's history has been reviewed, patient examined, no change in status, stable for surgery.  I have reviewed the patient's chart and labs.  Questions were answered to the patient's satisfaction.     Kirk Ruths

## 2018-09-13 ENCOUNTER — Encounter: Payer: Self-pay | Admitting: Cardiology

## 2018-09-13 ENCOUNTER — Ambulatory Visit: Payer: Medicare Other | Admitting: Cardiology

## 2018-09-13 ENCOUNTER — Encounter (INDEPENDENT_AMBULATORY_CARE_PROVIDER_SITE_OTHER): Payer: Self-pay

## 2018-09-13 VITALS — BP 122/68 | HR 118 | Ht 73.0 in | Wt 194.8 lb

## 2018-09-13 DIAGNOSIS — I5032 Chronic diastolic (congestive) heart failure: Secondary | ICD-10-CM

## 2018-09-13 DIAGNOSIS — I4819 Other persistent atrial fibrillation: Secondary | ICD-10-CM

## 2018-09-13 DIAGNOSIS — I10 Essential (primary) hypertension: Secondary | ICD-10-CM

## 2018-09-13 NOTE — Progress Notes (Signed)
Electrophysiology Office Note   Date:  09/13/2018   ID:  Cynthia Combs, DOB 15-Oct-1941, MRN [id number]  PCP:  Crist Infante, MD  Cardiologist:  [facility] Primary Electrophysiologist:  Riki Berninger Meredith Leeds, MD    No chief complaint on file.    History of Present Illness: Cynthia Combs is a 77 y.o. female who is being seen today for the evaluation of atrial fibrillaiton at the request of Richardson Dopp. Presenting today for electrophysiology evaluation.  She has a history of persistent atrial fibrillation, hypertension, hyperlipidemia, hypothyroidism, diastolic heart failure, and prior stroke noted on MRI.  She underwent successful cardioversion 04/2018 but had return of atrial fibrillation.  She was admitted for dofetilide load, but had torsades and thus was taken off of the medication.  She was previously having lower extremity swelling and stopped her diltiazem which improved her swelling.  She has been put on prednisone for her joint swelling.  At follow-up, she was found to be back in atrial fibrillation with symptoms of fatigue and palpitations.  She is currently on Eliquis.  She had cardioversion 09/01/2018.  Lasted for 4 days after her cardioversion.  She currently complains of mild weakness and fatigue.  She says that she knew that she went back into atrial fibrillation due to this.    Today, she denies symptoms of palpitations, chest pain, shortness of breath, orthopnea, PND, lower extremity edema, claudication, dizziness, presyncope, syncope, bleeding, or neurologic sequela. The patient is tolerating medications without difficulties.    Past Medical History:  Diagnosis Date  . Anemia   . Atrial fibrillation ([place])   . Cyst    HX of   . Hemorrhoids   . Hyperlipemia   . Hypertension   . Hyperthyroidism   . Thyroid nodule    Past Surgical History:  Procedure Laterality Date  . ABDOMINAL EXPLORATION SURGERY    . ABDOMINAL HYSTERECTOMY    . APPENDECTOMY    . CARDIOVERSION N/A  04/15/2018   Procedure: CARDIOVERSION;  Surgeon: Fay Records, MD;  Location: [facility] ENDOSCOPY;  Service: Cardiovascular;  Laterality: N/A;  . CARDIOVERSION N/A 09/01/2018   Procedure: CARDIOVERSION;  Surgeon: Lelon Perla, MD;  Location[facility] ENDOSCOPY;  Service: Cardiovascular;  Laterality: N/A;  . CHOLECYSTECTOMY    . COLON SURGERY     Due to large cyst/tumor--Benign  . DILATION AND CURETTAGE OF UTERUS    . INCISE AND DRAIN ABCESS     left hand  . KNEE SURGERY     Right      Current Outpatient Medications  Medication Sig Dispense Refill  . acetaminophen (TYLENOL) 500 MG tablet Take 1,000 mg by mouth every 6 (six) hours as needed for moderate pain or headache.     [name] apixaban (ELIQUIS) 5 MG TABS tablet Take 5 mg by mouth 2 (two) times daily. (1000 & 2200)    . Ascorbic Acid (VITAMIN C) 1000 MG tablet Take 1,000 mg by mouth at bedtime. (2200)    . Calcium Carb-Cholecalciferol (CALCIUM 600-D PO) Take 2 tablets by mouth at bedtime.     . Cholecalciferol (VITAMIN D) 2000 units CAPS Take 4,000 Units by mouth at bedtime. (2200)    . estradiol (ESTRACE) 0.5 MG tablet Take 0.25 mg by mouth every Monday, Wednesday, and Friday. (1000)    . folic acid (FOLVITE) 132 MCG tablet Take 400 mcg by mouth every morning. (1000)    . KLOR-CON M20 20 MEQ tablet TAKE 1 TABLET BY MOUTH EVERY DAY (Patient taking differently: Take 20  mEq by mouth every morning. ) 90 tablet 1  . levothyroxine (SYNTHROID, LEVOTHROID) 75 MCG tablet Take 75 mcg by mouth every morning. DAW; PT TAKES NAME BRAND SYNTHROID     . losartan (COZAAR) 50 MG tablet Take 50 mg by mouth daily at 3 pm. (1600)    . Magnesium 250 MG TABS Take 250 mg by mouth every morning.     . metoprolol succinate (TOPROL-XL) 100 MG 24 hr tablet Take 1 tablet (100 mg total) by mouth 2 (two) times daily. Take with or immediately following a meal. (Patient taking differently: Take 100 mg by mouth 2 (two) times daily. (1000 & 1600)Take with or immediately following a  meal.) 180 tablet 3  . pantoprazole (PROTONIX) 40 MG tablet Take 40 mg by mouth every morning.   11  . predniSONE (DELTASONE) 10 MG tablet Take 10 mg by mouth every morning.   1  . simvastatin (ZOCOR) 20 MG tablet Take 20 mg by mouth at bedtime. (2200)    . traMADol (ULTRAM) 50 MG tablet Take 50 mg by mouth every 8 (eight) hours as needed (pain.).   4  . vitamin B-12 (CYANOCOBALAMIN) 1000 MCG tablet Take 1,000 mcg by mouth every morning.    . furosemide (LASIX) 40 MG tablet Take 1 tablet (40 mg total) by mouth daily. (Patient taking differently: Take 40 mg by mouth every morning. ) 90 tablet 3   No current facility-administered medications for this visit.     Allergies:   Crestor [rosuvastatin calcium]; Dofetilide; Lisinopril; Niacin and related; Other; Prevnar 13 [pneumococcal 13-val conj vacc]; and Singulair [montelukast sodium]   Social History:  The patient  reports that she has quit smoking. She has never used smokeless tobacco. She reports that she does not drink alcohol or use drugs.   Family History:  The patient's family history includes Breast cancer in her maternal aunt; Colon polyps in her mother and sister; Heart disease in her mother; Kidney disease in her father.    ROS:  Please see the history of present illness.   Otherwise, review of systems is positive for palpitations, muscle pain, joint swelling.   All other systems are reviewed and negative.    PHYSICAL EXAM: VS:  BP 122/68   Pulse (!) 118   Ht 6\' 1"  (1.854 m)   Wt 194 lb 12.8 oz (88.4 kg)   SpO2 98%   BMI 25.70 kg/m  , BMI Body mass index is 25.7 kg/m. GEN: Well nourished, well developed, in no acute distress  HEENT: normal  Neck: no JVD, carotid bruits, or masses Cardiac: iRRR; no murmurs, rubs, or gallops,no edema  Respiratory:  clear to auscultation bilaterally, normal work of breathing GI: soft, nontender, nondistended, + BS MS: no deformity or atrophy  Skin: warm and dry Neuro:  Strength and sensation  are intact Psych: euthymic mood, full affect  EKG:  EKG is ordered today. Personal review of the ekg ordered shows atrial fibrillation, rate 118  Recent Labs: 04/08/2018: ALT 24; B Natriuretic Peptide 154.7 04/09/2018: TSH 1.413 05/20/2018: Magnesium 1.8 07/20/2018: BUN 20; Creatinine, Ser 0.77; NT-Pro BNP 844; Platelets 427 09/01/2018: Hemoglobin 13.3; Potassium 4.1; Sodium 135    Lipid Panel     Component Value Date/Time   CHOL 94 04/09/2018 0303   TRIG 69 04/09/2018 0303   HDL 34 (L) 04/09/2018 0303   CHOLHDL 2.8 04/09/2018 0303   VLDL 14 04/09/2018 0303   LDLCALC 46 04/09/2018 0303     Wt Readings from  Last 3 Encounters:  09/13/18 194 lb 12.8 oz (88.4 kg)  09/01/18 195 lb 6.3 oz (88.6 kg)  08/30/18 195 lb 6.4 oz (88.6 kg)      Other studies Reviewed: Additional studies/ records that were reviewed today include: TTE 04/09/18  Review of the above records today demonstrates:  - Left ventricle: The cavity size was normal. Wall thickness was   increased in a pattern of mild LVH. Systolic function was normal.   The estimated ejection fraction was in the range of 55% to 60%.   Wall motion was normal; there were no regional wall motion   abnormalities. The study was not technically sufficient to allow   evaluation of LV diastolic dysfunction due to atrial   fibrillation. - Aortic valve: Mildly calcified annulus. Trileaflet. - Mitral valve: There was mild regurgitation. - Right ventricle: Systolic function was reduced. - Right atrium: The atrium was mildly dilated. - Atrial septum: No defect or patent foramen ovale was identified. - Tricuspid valve: There was moderate regurgitation. - Pulmonic valve: There was mild regurgitation. - Pulmonary arteries: PA peak pressure: 55 mm Hg (S). - Systemic veins: Dilated IVC with normal respiratory variation.   Estimated CVP 8 mmHg. - Pericardium, extracardiac: A small pericardial effusion was   identified. Features were not consistent  with tamponade   physiology.   ASSESSMENT AND PLAN:  1.  Persistent atrial fibrillation: Developed torsades with dofetilide.  Currently on Eliquis.  Had a recent cardioversion.  I talked her about the possibility of ablation versus flecainide for her atrial fibrillation.  At this point she would like to consider her options further.  We Raynald Rouillard give her information on ablation.  This patients CHA2DS2-VASc Score and unadjusted Ischemic Stroke Rate (% per year) is equal to 4.8 % stroke rate/year from a score of 4  Above score calculated as 1 point each if present [CHF, HTN, DM, Vascular=MI/PAD/Aortic Plaque, Age if 65-74, or Female] Above score calculated as 2 points each if present [Age > 75, or Stroke/TIA/TE]  2.  Hypertension: Well-controlled.  No changes.  3.  Diastolic heart failure: No signs of volume overload    Current medicines are reviewed at length with the patient today.   The patient does not have concerns regarding her medicines.  The following changes were made today:  none  Labs/ tests ordered today include:  Orders Placed This Encounter  Procedures  . EKG 12-Lead     Disposition:   FU with Keilana Morlock 2 months  Signed, Zakirah Weingart Meredith Leeds, MD  09/13/2018 2:27 PM     [address] [place] [address] [phone] (office) [phone] (fax)

## 2018-09-13 NOTE — Patient Instructions (Signed)
Medication Instructions:  Your physician recommends that you continue on your current medications as directed. Please refer to the Current Medication list given to you today.  If you need a refill on your cardiac medications before your next appointment, please call your pharmacy.   Lab work: None ordered  Testing/Procedures: None ordered   Follow-Up: Your physician recommends that you schedule a follow-up appointment in: January with Dr. Curt Bears.  Any Other Special Instructions Will Be Listed Below (If Applicable).  Atrial Fibrillation Atrial fibrillation is a type of irregular or rapid heartbeat (arrhythmia). In atrial fibrillation, the heart quivers continuously in a chaotic pattern. This occurs when parts of the heart receive disorganized signals that make the heart unable to pump blood normally. This can increase the risk for stroke, heart failure, and other heart-related conditions. There are different types of atrial fibrillation, including:  Paroxysmal atrial fibrillation. This type starts suddenly, and it usually stops on its own shortly after it starts.  Persistent atrial fibrillation. This type often lasts longer than a week. It may stop on its own or with treatment.  Long-lasting persistent atrial fibrillation. This type lasts longer than 12 months.  Permanent atrial fibrillation. This type does not go away.  Talk with your health care provider to learn about the type of atrial fibrillation that you have. What are the causes? This condition is caused by some heart-related conditions or procedures, including:  A heart attack.  Coronary artery disease.  Heart failure.  Heart valve conditions.  High blood pressure.  Inflammation of the sac that surrounds the heart (pericarditis).  Heart surgery.  Certain heart rhythm disorders, such as Wolf-Parkinson-White syndrome.  Other causes include:  Pneumonia.  Obstructive sleep apnea.  Blockage of an artery in  the lungs (pulmonary embolism, or PE).  Lung cancer.  Chronic lung disease.  Thyroid problems, especially if the thyroid is overactive (hyperthyroidism).  Caffeine.  Excessive alcohol use or illegal drug use.  Use of some medicines, including certain decongestants and diet pills.  Sometimes, the cause cannot be found. What increases the risk? This condition is more likely to develop in:  People who are older in age.  People who smoke.  People who have diabetes mellitus.  People who are overweight (obese).  Athletes who exercise vigorously.  What are the signs or symptoms? Symptoms of this condition include:  A feeling that your heart is beating rapidly or irregularly.  A feeling of discomfort or pain in your chest.  Shortness of breath.  Sudden light-headedness or weakness.  Getting tired easily during exercise.  In some cases, there are no symptoms. How is this diagnosed? Your health care provider may be able to detect atrial fibrillation when taking your pulse. If detected, this condition may be diagnosed with:  An electrocardiogram (ECG).  A Holter monitor test that records your heartbeat patterns over a 24-hour period.  Transthoracic echocardiogram (TTE) to evaluate how blood flows through your heart.  Transesophageal echocardiogram (TEE) to view more detailed images of your heart.  A stress test.  Imaging tests, such as a CT scan or chest X-ray.  Blood tests.  How is this treated? The main goals of treatment are to prevent blood clots from forming and to keep your heart beating at a normal rate and rhythm. The type of treatment that you receive depends on many factors, such as your underlying medical conditions and how you feel when you are experiencing atrial fibrillation. This condition may be treated with:  Medicine to slow  down the heart rate, bring the heart's rhythm back to normal, or prevent clots from forming.  Electrical cardioversion.  This is a procedure that resets your heart's rhythm by delivering a controlled, low-energy shock to the heart through your skin.  Different types of ablation, such as catheter ablation, catheter ablation with pacemaker, or surgical ablation. These procedures destroy the heart tissues that send abnormal signals. When the pacemaker is used, it is placed under your skin to help your heart beat in a regular rhythm.  Follow these instructions at home:  Take over-the counter and prescription medicines only as told by your health care provider.  If your health care provider prescribed a blood-thinning medicine (anticoagulant), take it exactly as told. Taking too much blood-thinning medicine can cause bleeding. If you do not take enough blood-thinning medicine, you will not have the protection that you need against stroke and other problems.  Do not use tobacco products, including cigarettes, chewing tobacco, and e-cigarettes. If you need help quitting, ask your health care provider.  If you have obstructive sleep apnea, manage your condition as told by your health care provider.  Do not drink alcohol.  Do not drink beverages that contain caffeine, such as coffee, soda, and tea.  Maintain a healthy weight. Do not use diet pills unless your health care provider approves. Diet pills may make heart problems worse.  Follow diet instructions as told by your health care provider.  Exercise regularly as told by your health care provider.  Keep all follow-up visits as told by your health care provider. This is important. How is this prevented?  Avoid drinking beverages that contain caffeine or alcohol.  Avoid certain medicines, especially medicines that are used for breathing problems.  Avoid certain herbs and herbal medicines, such as those that contain ephedra or ginseng.  Do not use illegal drugs, such as cocaine and amphetamines.  Do not smoke.  Manage your high blood pressure. Contact a  health care provider if:  You notice a change in the rate, rhythm, or strength of your heartbeat.  You are taking an anticoagulant and you notice increased bruising.  You tire more easily when you exercise or exert yourself. Get help right away if:  You have chest pain, abdominal pain, sweating, or weakness.  You feel nauseous.  You notice blood in your vomit, bowel movement, or urine.  You have shortness of breath.  You suddenly have swollen feet and ankles.  You feel dizzy.  You have sudden weakness or numbness of the face, arm, or leg, especially on one side of the body.  You have trouble speaking, trouble understanding, or both (aphasia).  Your face or your eyelid droops on one side. These symptoms may represent a serious problem that is an emergency. Do not wait to see if the symptoms will go away. Get medical help right away. Call your local emergency services (911 in the [place] Do not drive yourself to the hospital. This information is not intended to replace advice given to you by your health care provider. Make sure you discuss any questions you have with your health care provider. Document Released: 10/26/2005 Document Revised: 03/04/2016 Document Reviewed: 02/20/2015 Elsevier Interactive Patient Education  2018 [employer].    Cardiac Ablation Cardiac ablation is a procedure to disable (ablate) a small amount of heart tissue in very specific places. The heart has many electrical connections. Sometimes these connections are abnormal and can cause the heart to beat very fast or irregularly. Ablating some of  the problem areas can improve the heart rhythm or return it to normal. Ablation may be done for people who:  Have Wolff-Parkinson-White syndrome.  Have fast heart rhythms (tachycardia).  Have taken medicines for an abnormal heart rhythm (arrhythmia) that were not effective or caused side effects.  Have a high-risk heartbeat that may be  life-threatening.  During the procedure, a small incision is made in the neck or the groin, and a long, thin, flexible tube (catheter) is inserted into the incision and moved to the heart. Small devices (electrodes) on the tip of the catheter will send out electrical currents. A type of X-ray (fluoroscopy) will be used to help guide the catheter and to provide images of the heart. Tell a health care provider about:  Any allergies you have.  All medicines you are taking, including vitamins, herbs, eye drops, creams, and over-the-counter medicines.  Any problems you or family members have had with anesthetic medicines.  Any blood disorders you have.  Any surgeries you have had.  Any medical conditions you have, such as kidney failure.  Whether you are pregnant or may be pregnant. What are the risks? Generally, this is a safe procedure. However, problems may occur, including:  Infection.  Bruising and bleeding at the catheter insertion site.  Bleeding into the chest, especially into the sac that surrounds the heart. This is a serious complication.  Stroke or blood clots.  Damage to other structures or organs.  Allergic reaction to medicines or dyes.  Need for a permanent pacemaker if the normal electrical system is damaged. A pacemaker is a small computer that sends electrical signals to the heart and helps your heart beat normally.  The procedure not being fully effective. This may not be recognized until months later. Repeat ablation procedures are sometimes required.  What happens before the procedure?  Follow instructions from your health care provider about eating or drinking restrictions.  Ask your health care provider about: ? Changing or stopping your regular medicines. This is especially important if you are taking diabetes medicines or blood thinners. ? Taking medicines such as aspirin and ibuprofen. These medicines can thin your blood. Do not take these medicines  before your procedure if your health care provider instructs you not to.  Plan to have someone take you home from the hospital or clinic.  If you will be going home right after the procedure, plan to have someone with you for 24 hours. What happens during the procedure?  To lower your risk of infection: ? Your health care team will wash or sanitize their hands. ? Your skin will be washed with soap. ? Hair may be removed from the incision area.  An IV tube will be inserted into one of your veins.  You will be given a medicine to help you relax (sedative).  The skin on your neck or groin will be numbed.  An incision will be made in your neck or your groin.  A needle will be inserted through the incision and into a large vein in your neck or groin.  A catheter will be inserted into the needle and moved to your heart.  Dye may be injected through the catheter to help your surgeon see the area of the heart that needs treatment.  Electrical currents will be sent from the catheter to ablate heart tissue in desired areas. There are three types of energy that may be used to ablate heart tissue: ? Heat (radiofrequency energy). ? Laser energy. ? Extreme  cold (cryoablation).  When the necessary tissue has been ablated, the catheter will be removed.  Pressure will be held on the catheter insertion area to prevent excessive bleeding.  A bandage (dressing) will be placed over the catheter insertion area. The procedure may vary among health care providers and hospitals. What happens after the procedure?  Your blood pressure, heart rate, breathing rate, and blood oxygen level will be monitored until the medicines you were given have worn off.  Your catheter insertion area will be monitored for bleeding. You will need to lie still for a few hours to ensure that you do not bleed from the catheter insertion area.  Do not drive for 24 hours or as long as directed by your health care  provider. Summary  Cardiac ablation is a procedure to disable (ablate) a small amount of heart tissue in very specific places. Ablating some of the problem areas can improve the heart rhythm or return it to normal.  During the procedure, electrical currents will be sent from the catheter to ablate heart tissue in desired areas. This information is not intended to replace advice given to you by your health care provider. Make sure you discuss any questions you have with your health care provider. Document Released: 03/14/2009 Document Revised: 09/14/2016 Document Reviewed: 09/14/2016 Elsevier Interactive Patient Education  [employer].

## 2018-11-14 ENCOUNTER — Ambulatory Visit: Payer: Self-pay | Admitting: Neurology

## 2018-11-24 ENCOUNTER — Ambulatory Visit: Payer: Medicare Other | Admitting: Neurology

## 2018-11-24 ENCOUNTER — Encounter: Payer: Self-pay | Admitting: Neurology

## 2018-11-24 VITALS — BP 129/76 | HR 84 | Ht 73.0 in | Wt 206.0 lb

## 2018-11-24 DIAGNOSIS — I639 Cerebral infarction, unspecified: Secondary | ICD-10-CM

## 2018-11-24 DIAGNOSIS — D320 Benign neoplasm of cerebral meninges: Secondary | ICD-10-CM

## 2018-11-24 NOTE — Patient Instructions (Addendum)
Thankfully, you do not have any headache or neurological signs or symptoms. 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  As discussed, secondary prevention is key after a stroke. This means: taking care of blood sugar values or diabetes management (A1c goal of less than 7.0), good blood pressure (hypertension) control and optimizing cholesterol management (with LDL goal of less than 70), exercising daily or regularly within your own mobility limitations of course, and overall cardiovascular risk factor reduction, which includes screening for and treatment of obstructive sleep apnea (OSA) and weight management.   I would recommend the following:   1. Sleep study to rule out sleep apnea (OSA), as sleep apnea is considered a risk factor for stroke and for A fib.  Please remember, the long-term risks and ramifications of untreated moderate to severe obstructive sleep apnea are: increased Cardiovascular disease, including congestive heart failure, stroke, difficult to control hypertension, treatment resistant obesity, arrhythmias, especially irregular heartbeat commonly known as A. Fib. (atrial fibrillation); even type 2 diabetes has been linked to untreated OSA.   2. I would recommend, you have a repeat brain scan, MRI for monitoring the meningioma. Ideally, with contrast. This test requires authorization from your insurance, and we will take care of the insurance process. I will hold off for now, would recommend about 6 months after the first MRI which would be around March this year.  3. I would like to refer you to a neurosurgeon for evaluation. Please talk to Dr. Joylene Draft about it, he may want you to see someone in particular. We can wait it out until you see him later this month. Let me know.   4. Follow up routinely in 6 months.

## 2018-11-24 NOTE — Progress Notes (Signed)
Subjective:    Patient ID: Cynthia Combs is a 78 y.o. female.  HPI     Cynthia Age, MD, PhD [facility] [address] P.O. [place], [place] [address]  Dear Nicki Reaper,   I saw you patient, Cynthia Combs, upon your kind request in my neurologic clinic today for initial consultation of her history of stroke. The patient is unaccompanied today. As you know, Cynthia Combs is a 78 year old right-handed woman with an underlying complex medical history of A. fib with RVR, status post cardioversion with recurrence of A. fib, congestive heart failure, anemia, hypertension, hyperlipidemia, hyperthyroidism, cerebellar pontine angle tumor/meningioma, and borderline overweight state, who reports that she was found to have a mass on a CT last year. She was told it was a meningioma. She then had a recent brain MRI which showed a small stroke. Of note, she has not had any hearing loss or vertigo. She was sick last year with a streptococcal infection. 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 She sees rheumatology for this. She has not had any telltale response from oral methotrexate she reports. She may be getting methotrexate injections and will then be considered for Enbrel. She has been on Eliquis for her A. fib, she also takes cholesterol medicine. She had cardioversion attempted twice and also was hospitalized for her Tikosyn infusion but she is still in A. fib. She reports snoring but believes she does not have sleep apnea. She lives alone. She has 2 grown children and 2 grandchildren. She does not smoke or drink alcohol, she does not utilize caffeine on a regular basis. She has 1 dog. She denies morning headaches but does have significant nocturia about 3-4 times for average night. She had a tonsillectomy and adenoidectomy as a child. She is not aware of any family history of OSA. She has never had any one-sided weakness or numbness or tingling. About 2  years ago she had an episode of dehydration. She recalls that she was working in the yard and she had not been hydrating well. I reviewed your office note from 08/30/2018.  She had a brain MRI without contrast on 07/12/2018 and I reviewed the results: IMPRESSION: 1. Punctate focus of acute ischemia in the left frontal white matter without hemorrhage or mass effect. 2. 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   Her Past Medical History Is Significant For: Past Medical History:  Diagnosis Date  . Anemia   . Atrial fibrillation ([employer])   . Cyst    HX of   . Hemorrhoids   . Hyperlipemia   . Hypertension   . Hyperthyroidism   . Thyroid nodule     Her Past Surgical History Is Significant For: Past Surgical History:  Procedure Laterality Date  . ABDOMINAL EXPLORATION SURGERY    . ABDOMINAL HYSTERECTOMY    . APPENDECTOMY    . CARDIOVERSION N/A 04/15/2018   Procedure: CARDIOVERSION;  Surgeon: Fay Records, MD;  Location: [facility] ENDOSCOPY;  Service: Cardiovascular;  Laterality: N/A;  . CARDIOVERSION N/A 09/01/2018   Procedure: CARDIOVERSION;  Surgeon: Lelon Perla, MD;  Location: [facility] ENDOSCOPY;  Service: Cardiovascular;  Laterality: N/A;  . CHOLECYSTECTOMY    . COLON SURGERY     Due to large cyst/tumor--Benign  . DILATION AND CURETTAGE OF UTERUS    . INCISE AND DRAIN ABCESS     left hand  . KNEE SURGERY     Right  Her Family History Is Significant For: Family History  Problem Relation Combs of Onset  . Colon polyps Mother   . Heart disease Mother   . Colon polyps Sister   . Breast cancer Maternal Aunt   . Kidney disease Father   . Colon cancer Neg Hx     Her Social History Is Significant For: Social History   Socioeconomic History  . Marital status: Divorced    Spouse name: Not on file  . Number of children: 2  . Years of education: Not on file  . Highest education level: Not on file  Occupational  History  . Occupation: Therapist, sports     Comment: Retired   Scientific laboratory technician  . Financial resource strain: Not on file  . Food insecurity:    Worry: Not on file    Inability: Not on file  . Transportation needs:    Medical: Not on file    Non-medical: Not on file  Tobacco Use  . Smoking status: Former Research scientist (life sciences)  . Smokeless tobacco: Never Used  Substance and Sexual Activity  . Alcohol use: No  . Drug use: No  . Sexual activity: Not on file  Lifestyle  . Physical activity:    Days per week: Not on file    Minutes per session: Not on file  . Stress: Not on file  Relationships  . Social connections:    Talks on phone: Not on file    Gets together: Not on file    Attends religious service: Not on file    Active member of club or organization: Not on file    Attends meetings of clubs or organizations: Not on file    Relationship status: Not on file  Other Topics Concern  . Not on file  Social History Narrative  . Not on file    Her Allergies Are:  Allergies  Allergen Reactions  . Crestor [Rosuvastatin Calcium] Other (See Comments)    Muscle weakness  . Dofetilide     Torsades  . Lisinopril Other (See Comments)    Unknown reaction - [facility] by [facility]  . Niacin And Related Other (See Comments)    Facial redness  . Other Other (See Comments)    Passed out after swine flu shot, couldn't move for several days  . Prevnar 13 [Pneumococcal 13-Val Conj Vacc] Other (See Comments)    Arm tingled for months after administration  . Singulair [Montelukast Sodium] Other (See Comments)    Blood pressure increased  :   Her Current Medications Are:  Outpatient Encounter Medications as of 11/24/2018  Medication Sig  . acetaminophen (TYLENOL) 500 MG tablet Take 1,000 mg by mouth every 6 (six) hours as needed for moderate pain or headache.   [name] apixaban (ELIQUIS) 5 MG TABS tablet Take 5 mg by mouth 2 (two) times daily. (1000 & 2200)  . Ascorbic Acid (VITAMIN C) 1000 MG tablet  Take 1,000 mg by mouth at bedtime. (2200)  . Calcium Carb-Cholecalciferol (CALCIUM 600-D PO) Take 2 tablets by mouth at bedtime.   . Cholecalciferol (VITAMIN D) 2000 units CAPS Take 4,000 Units by mouth at bedtime. (2200)  . estradiol (ESTRACE) 0.5 MG tablet Take 0.25 mg by mouth every Monday, Wednesday, and Friday. (1000)  . FOLIC ACID PO Take 8,850 mcg by mouth daily.  [name] KLOR-CON M20 20 MEQ tablet TAKE 1 TABLET BY MOUTH EVERY DAY (Patient taking differently: Take 20 mEq by mouth every morning. )  . levothyroxine (SYNTHROID, LEVOTHROID) 75  MCG tablet Take 75 mcg by mouth every morning. DAW; PT TAKES NAME BRAND SYNTHROID   . losartan (COZAAR) 50 MG tablet Take 50 mg by mouth daily at 3 pm. (1600)  . Magnesium 250 MG TABS Take 250 mg by mouth every morning.   . Methotrexate, Anti-Rheumatic, (METHOTREXATE, PF, Morse) Inject into the skin once a week.  . pantoprazole (PROTONIX) 40 MG tablet Take 40 mg by mouth every morning.   . predniSONE (DELTASONE) 10 MG tablet Take 10 mg by mouth every morning. Follow taper instructions.  . simvastatin (ZOCOR) 20 MG tablet Take 20 mg by mouth at bedtime. (2200)  . traMADol (ULTRAM) 50 MG tablet Take 50 mg by mouth every 8 (eight) hours as needed (pain.).   [name] vitamin B-12 (CYANOCOBALAMIN) 1000 MCG tablet Take 1,000 mcg by mouth every morning.  . furosemide (LASIX) 40 MG tablet Take 1 tablet (40 mg total) by mouth daily. (Patient taking differently: Take 40 mg by mouth every morning. )  . metoprolol succinate (TOPROL-XL) 100 MG 24 hr tablet Take 1 tablet (100 mg total) by mouth 2 (two) times daily. Take with or immediately following a meal. (Patient taking differently: Take 100 mg by mouth 2 (two) times daily. (1000 & 1600)Take with or immediately following a meal.)  . [DISCONTINUED] folic acid (FOLVITE) 678 MCG tablet Take 400 mcg by mouth every morning. (1000)   No facility-administered encounter medications on file as of 11/24/2018.    Review of Systems:  Out of  a complete 14 point review of systems, all are reviewed and negative with the exception of these symptoms as listed below:  Review of Systems  Neurological:       Pt presents today to discuss her stroke and her "brain tumors." Pt reports that Dr. Joylene Draft told her to get established with a neurologist.    Objective:  Neurological Exam  Physical Exam Physical Examination:   Vitals:   11/24/18 1025  BP: 129/76  Pulse: 84   General Examination: The patient is a very pleasant 78 y.o. female in no acute distress. She appears well-developed and well-nourished and well groomed. Mildly anxious.   HEENT: Normocephalic, atraumatic, pupils are equal, round and reactive to light and accommodation. Extraocular tracking is good without limitation to gaze excursion or nystagmus noted. Normal smooth pursuit is noted. Hearing is grossly intact. Face is symmetric with normal facial animation and normal facial sensation. Speech is clear with no dysarthria noted. There is no hypophonia. There is no lip, neck/head, jaw or voice tremor. Neck is supple with full range of passive and active motion. There are no carotid bruits on auscultation. Oropharynx exam reveals: mild mouth dryness, adequate dental hygiene and mild airway crowding, due to Smaller airway entry. Tonsils absent. Mallampati is class I. Tongue protrudes centrally and palate elevates symmetrically. Neck circumference is 16 inches.  Chest: Clear to auscultation without wheezing, rhonchi or crackles noted.  Heart: S1+S2+0, irregularly irregular, without murmurs, rubs or gallops noted.   Abdomen: Soft, non-tender and non-distended with normal bowel sounds appreciated on auscultation.  Extremities: There is 2+ pitting edema in the distal lower extremities bilaterally. Pedal pulses are intact.  Skin: Warm and dry without trophic changes noted.  Musculoskeletal: exam reveals Prominent arthritic changes affecting the hands, she has bilateral knee  swelling. She reports right hip pain. She has bilateral ankle swelling and ankle pain.  Neurologically:  Mental status: The patient is awake, alert and oriented in all 4 spheres. Her immediate and remote memory,  attention, language skills and fund of knowledge are appropriate. There is no evidence of aphasia, agnosia, apraxia or anomia. Speech is clear with normal prosody and enunciation. Thought process is linear. Mood is normal and affect is normal.  Cranial nerves II - XII are as described above under HEENT exam. In addition: shoulder shrug is normal with equal shoulder height noted. Motor exam: Normal bulk, strength and tone is noted. There is no drift, tremor or rebound. Romberg is negative. Reflexes are trace throughout but difficult to elicit due to swelling and pain. Fine motor skills and coordination: globally mildly impaired in the hands and feet.  Cerebellar testing: No dysmetria or intention tremor. Heel to shin is not possible d/t pain.  Sensory exam: intact to light touch.  Gait, station and balance: She stands with difficulty. No veering to one side is noted. No leaning to one side is noted. Posture is Combs-appropriate and stance is narrow based. Gait shows slow and cautious walk, in pain.   Assessment and Plan:   In summary, Braeley Buskey is a very pleasant 78 y.o.-year old female with an underlying complex medical history of A. fib with RVR, status post cardioversion with recurrence of A. fib, congestive heart failure, anemia, hypertension, hyperlipidemia, hyperthyroidism, cerebellar pontine angle tumor/meningioma, and borderline overweight state, who presents for evaluation of abnormalities found on recent MRI brain, including a small left frontal ischemic stroke as well as a right cerebellar pontine angle meningioma. Thankfully, she has no symptoms, no recurrent headaches, no hearing loss, no vertigo, no one-sided weakness or numbness. Nevertheless, she does have stroke risk factors. We  talked about secondary stroke prevention. She is advised to proceed with a sleep study as obstructive sleep apnea as a risk factor for stroke and A. Fib. Unfortunately, she has had unsuccessful cardioversion and pharmacological treatment for her A. Fib. She is currently working with her rheumatologist on her inflammatory arthritis. She reports a lot of joint pain. I also suggested we repeat her brain MRI about 6 months after the last which would be around March of this year. She would like to hold offas she reports she has a lot going on right now. I would like for her to seek consultation with a neurosurgeon as well. She would like to hold off for now and discuss this with her primary care physician which I understand. If she changes her mind regarding the sleep study we can schedule her at her convenience, I can also with a brain MRI as well as the referral to neurosurgery. For now, I suggested she follow-up routinely with me in about 6 months, sooner if needed.  She has a follow-up appointment coming up with her primary care soon, later this month. She is encouraged to call us for any updates or concerns or questions. I answered all her questions today and she was in agreement with the plan. Thank you very much for allowing me to participate in the care of this nice patient. If I can be of any further assistance to you please do not hesitate to call me at [phone].  Sincerely,   Cynthia Age, MD, PhD

## 2018-12-05 ENCOUNTER — Ambulatory Visit: Payer: Medicare Other | Admitting: Cardiology

## 2018-12-13 ENCOUNTER — Other Ambulatory Visit: Payer: Self-pay | Admitting: Internal Medicine

## 2018-12-13 DIAGNOSIS — D333 Benign neoplasm of cranial nerves: Secondary | ICD-10-CM

## 2018-12-13 DIAGNOSIS — D320 Benign neoplasm of cerebral meninges: Secondary | ICD-10-CM

## 2019-01-11 ENCOUNTER — Other Ambulatory Visit (HOSPITAL_COMMUNITY): Payer: Self-pay | Admitting: Nurse Practitioner

## 2019-03-27 ENCOUNTER — Other Ambulatory Visit: Payer: Self-pay | Admitting: Internal Medicine

## 2019-03-27 DIAGNOSIS — Z1231 Encounter for screening mammogram for malignant neoplasm of breast: Secondary | ICD-10-CM

## 2019-04-07 ENCOUNTER — Other Ambulatory Visit: Payer: Medicare Other

## 2019-07-13 ENCOUNTER — Other Ambulatory Visit (HOSPITAL_COMMUNITY): Payer: Self-pay | Admitting: Nurse Practitioner

## 2019-07-18 ENCOUNTER — Ambulatory Visit
Admission: RE | Admit: 2019-07-18 | Discharge: 2019-07-18 | Disposition: A | Discharge status: Home / Self Care | Payer: Medicare Other | Source: Ambulatory Visit | Attending: Internal Medicine | Admitting: Internal Medicine

## 2019-07-18 DIAGNOSIS — D333 Benign neoplasm of cranial nerves: Secondary | ICD-10-CM

## 2019-07-18 DIAGNOSIS — D320 Benign neoplasm of cerebral meninges: Secondary | ICD-10-CM

## 2019-08-16 ENCOUNTER — Other Ambulatory Visit: Payer: Self-pay

## 2019-08-16 ENCOUNTER — Ambulatory Visit
Admission: RE | Admit: 2019-08-16 | Discharge: 2019-08-16 | Disposition: A | Discharge status: Home / Self Care | Payer: Medicare Other | Source: Ambulatory Visit | Attending: Internal Medicine | Admitting: Internal Medicine

## 2019-08-16 DIAGNOSIS — Z1231 Encounter for screening mammogram for malignant neoplasm of breast: Secondary | ICD-10-CM

## 2019-09-21 ENCOUNTER — Other Ambulatory Visit: Payer: Self-pay | Admitting: Physician Assistant

## 2019-11-15 ENCOUNTER — Ambulatory Visit: Payer: Medicare Other | Attending: Internal Medicine

## 2019-11-15 DIAGNOSIS — Z20822 Contact with and (suspected) exposure to covid-19: Secondary | ICD-10-CM

## 2019-11-17 LAB — NOVEL CORONAVIRUS, NAA: SARS-CoV-2, NAA: NOT DETECTED

## 2020-07-02 ENCOUNTER — Other Ambulatory Visit: Payer: Self-pay | Admitting: Internal Medicine

## 2020-07-02 DIAGNOSIS — Z1231 Encounter for screening mammogram for malignant neoplasm of breast: Secondary | ICD-10-CM

## 2020-07-22 ENCOUNTER — Other Ambulatory Visit: Payer: Self-pay | Admitting: Internal Medicine

## 2020-07-22 DIAGNOSIS — D320 Benign neoplasm of cerebral meninges: Secondary | ICD-10-CM

## 2020-07-25 ENCOUNTER — Other Ambulatory Visit: Payer: Self-pay

## 2020-07-25 ENCOUNTER — Ambulatory Visit
Admission: RE | Admit: 2020-07-25 | Discharge: 2020-07-25 | Disposition: A | Discharge status: Home / Self Care | Payer: Medicare Other | Source: Ambulatory Visit | Attending: Family Medicine | Admitting: Family Medicine

## 2020-07-25 ENCOUNTER — Other Ambulatory Visit: Payer: Self-pay | Admitting: Family Medicine

## 2020-07-25 DIAGNOSIS — R1032 Left lower quadrant pain: Secondary | ICD-10-CM

## 2020-07-25 MED ORDER — IOPAMIDOL (ISOVUE-300) INJECTION 61%
100.0000 mL | Freq: Once | INTRAVENOUS | Status: AC | PRN
  Administered 2020-07-25: 100 mL via INTRAVENOUS

## 2020-08-06 ENCOUNTER — Encounter: Payer: Self-pay | Admitting: Nurse Practitioner

## 2020-08-11 ENCOUNTER — Ambulatory Visit
Admission: RE | Admit: 2020-08-11 | Discharge: 2020-08-11 | Disposition: A | Discharge status: Home / Self Care | Payer: Medicare Other | Source: Ambulatory Visit | Attending: Internal Medicine | Admitting: Internal Medicine

## 2020-08-11 ENCOUNTER — Other Ambulatory Visit: Payer: Self-pay

## 2020-08-11 DIAGNOSIS — D320 Benign neoplasm of cerebral meninges: Secondary | ICD-10-CM

## 2020-08-16 ENCOUNTER — Other Ambulatory Visit: Payer: Self-pay

## 2020-08-16 ENCOUNTER — Ambulatory Visit
Admission: RE | Admit: 2020-08-16 | Discharge: 2020-08-16 | Disposition: A | Discharge status: Home / Self Care | Payer: Medicare Other | Source: Ambulatory Visit | Attending: Internal Medicine | Admitting: Internal Medicine

## 2020-08-16 DIAGNOSIS — Z1231 Encounter for screening mammogram for malignant neoplasm of breast: Secondary | ICD-10-CM

## 2020-08-22 ENCOUNTER — Other Ambulatory Visit: Payer: Self-pay | Admitting: Internal Medicine

## 2020-08-22 DIAGNOSIS — R928 Other abnormal and inconclusive findings on diagnostic imaging of breast: Secondary | ICD-10-CM

## 2020-08-28 ENCOUNTER — Other Ambulatory Visit: Payer: Self-pay

## 2020-08-28 ENCOUNTER — Other Ambulatory Visit: Payer: Self-pay | Admitting: Internal Medicine

## 2020-08-28 ENCOUNTER — Ambulatory Visit
Admission: RE | Admit: 2020-08-28 | Discharge: 2020-08-28 | Disposition: A | Discharge status: Home / Self Care | Payer: Medicare Other | Source: Ambulatory Visit | Attending: Internal Medicine | Admitting: Internal Medicine

## 2020-08-28 DIAGNOSIS — R928 Other abnormal and inconclusive findings on diagnostic imaging of breast: Secondary | ICD-10-CM

## 2020-08-28 DIAGNOSIS — N6002 Solitary cyst of left breast: Secondary | ICD-10-CM

## 2020-09-05 ENCOUNTER — Ambulatory Visit: Payer: Medicare Other | Admitting: Nurse Practitioner

## 2021-02-28 ENCOUNTER — Other Ambulatory Visit: Payer: Self-pay | Admitting: Internal Medicine

## 2021-02-28 ENCOUNTER — Ambulatory Visit
Admission: RE | Admit: 2021-02-28 | Discharge: 2021-02-28 | Disposition: A | Discharge status: Home / Self Care | Payer: Medicare Other | Source: Ambulatory Visit | Attending: Internal Medicine | Admitting: Internal Medicine

## 2021-02-28 ENCOUNTER — Other Ambulatory Visit: Payer: Self-pay

## 2021-02-28 DIAGNOSIS — N6002 Solitary cyst of left breast: Secondary | ICD-10-CM

## 2021-09-01 ENCOUNTER — Other Ambulatory Visit: Payer: Self-pay

## 2021-09-01 ENCOUNTER — Ambulatory Visit
Admission: RE | Admit: 2021-09-01 | Discharge: 2021-09-01 | Disposition: A | Discharge status: Home / Self Care | Payer: Medicare Other | Source: Ambulatory Visit | Attending: Internal Medicine | Admitting: Internal Medicine

## 2021-09-01 DIAGNOSIS — N6002 Solitary cyst of left breast: Secondary | ICD-10-CM

## 2021-10-01 IMAGING — MG MM DIGITAL DIAGNOSTIC UNILAT*L* W/ TOMO W/ CAD
4 series · 4 of 12 positions shown · non-contrast
Comparison: Previous exam(s).

CLINICAL DATA: 78-year-old female recalled from screening mammogram
dated 08/16/2020 for a possible left breast mass.

EXAM:
DIGITAL DIAGNOSTIC LEFT MAMMOGRAM WITH CAD AND TOMO
ULTRASOUND LEFT BREAST

[L MLO synth-2D]
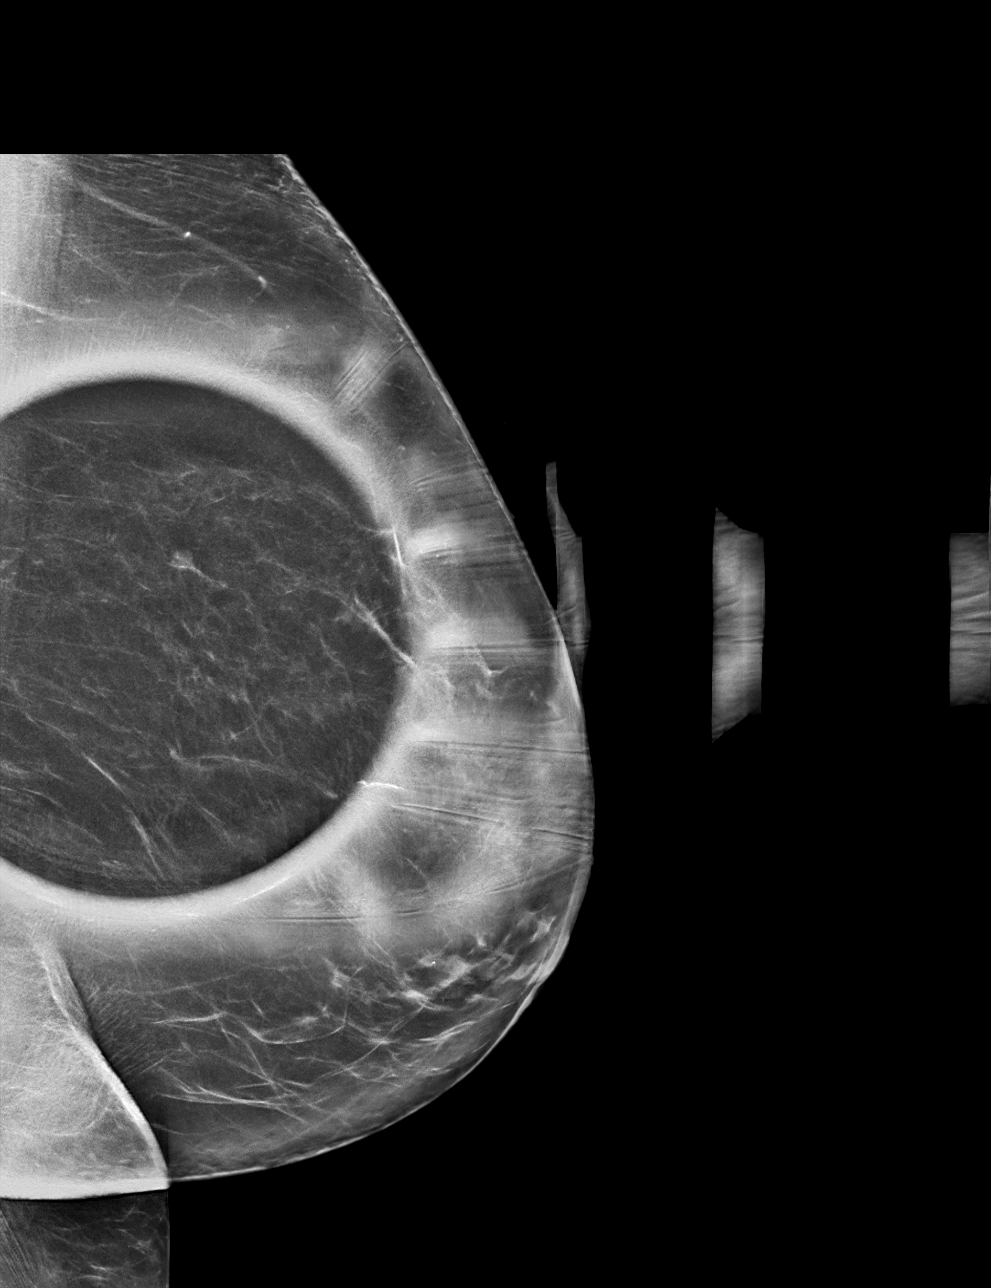

[L CC synth-2D]
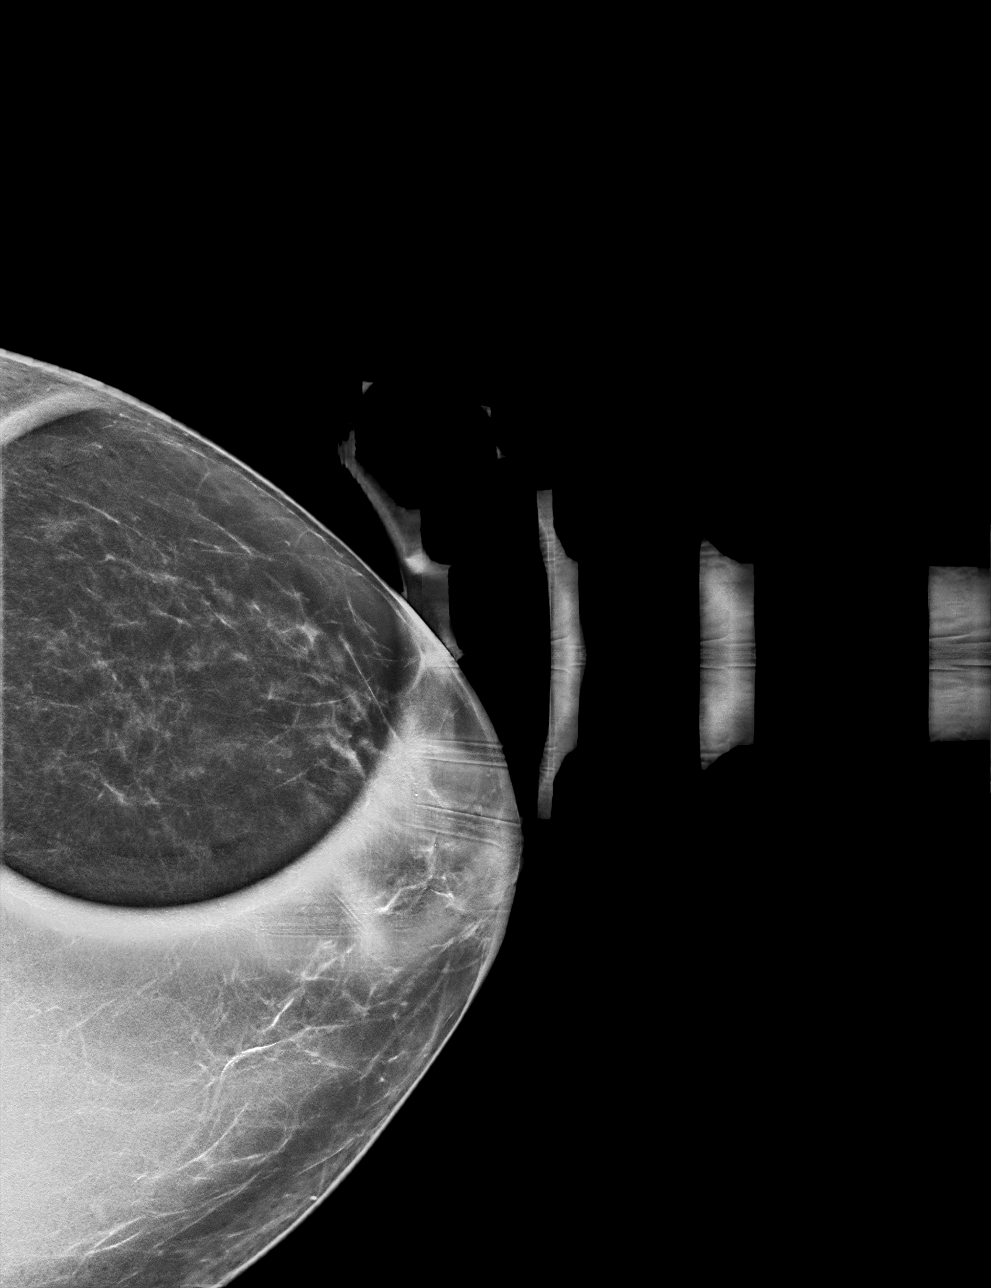

[L MLO tomo · tomo slice 37/72.0]
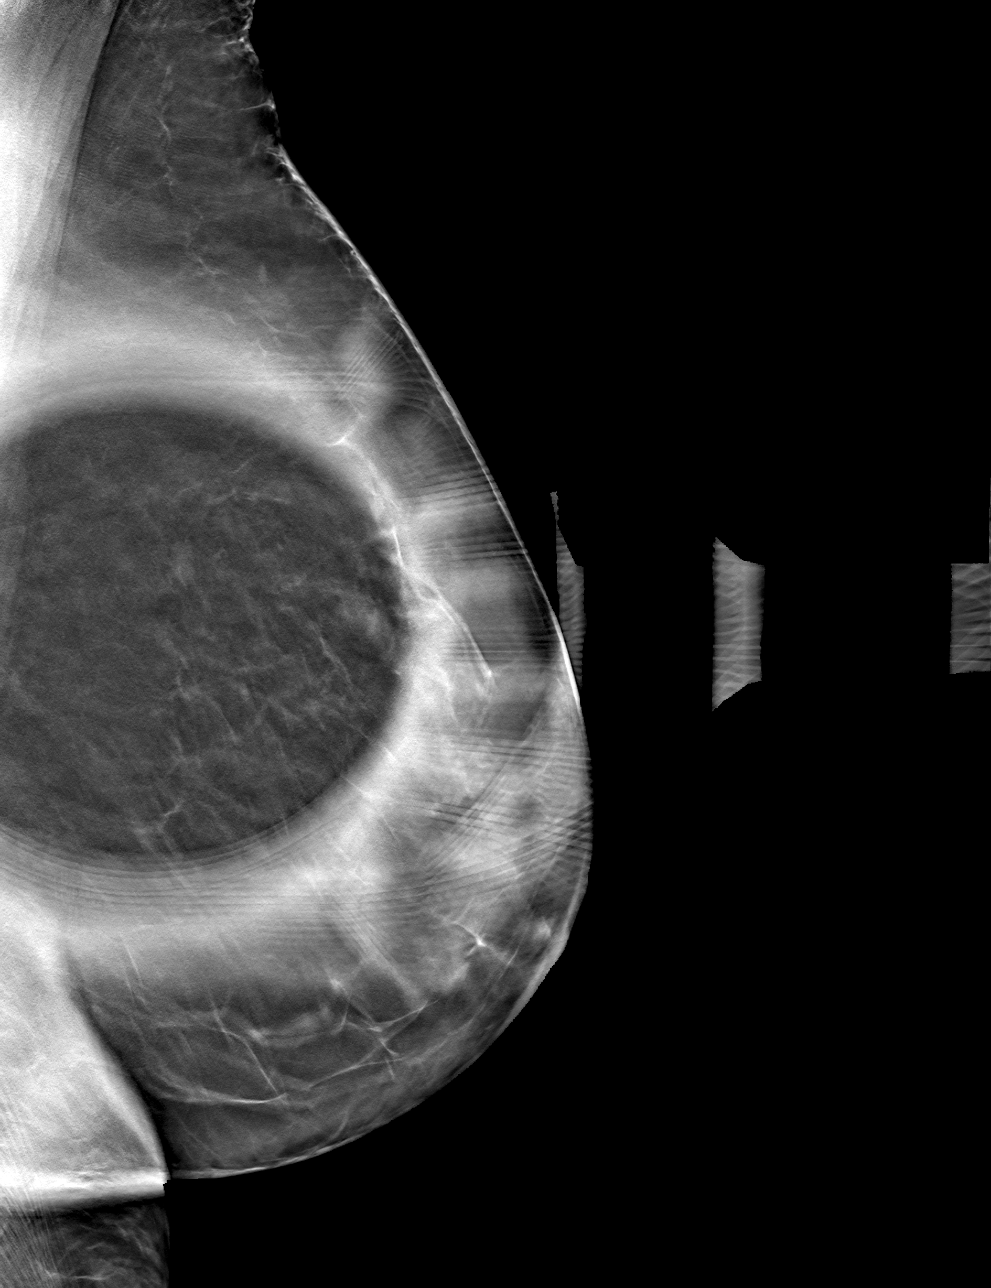

[L CC tomo · tomo slice 30/59.0]
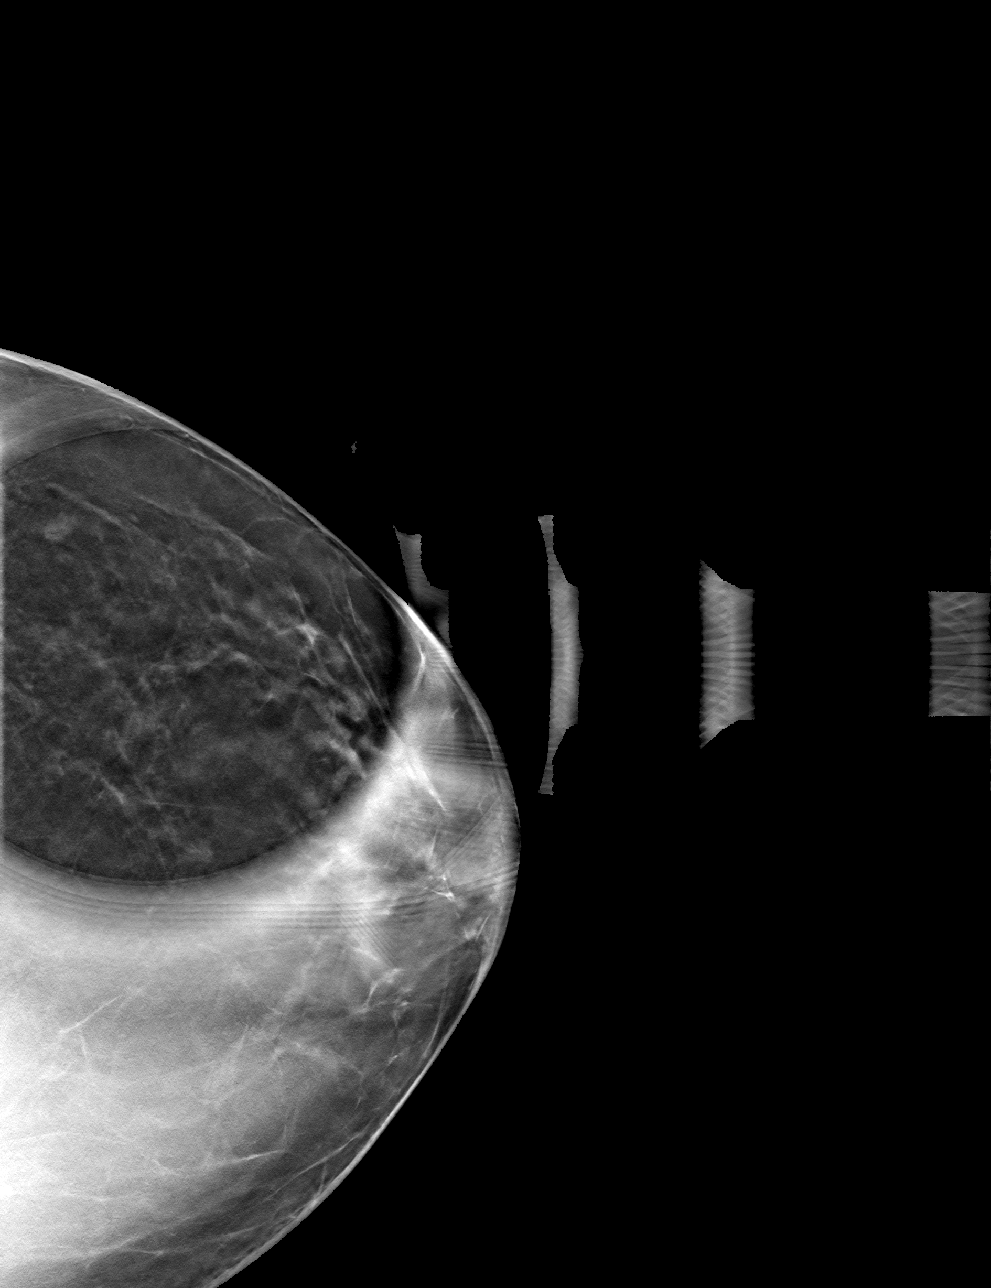

[4 of 12 positions shown; findings below may reference images not displayed]

ACR Breast Density Category b: There are scattered areas of
fibroglandular density.
FINDINGS: There is a persistent oval, circumscribed equal density mass in the
posterior upper outer quadrant of the left breast. Further
evaluation with ultrasound was performed.

Mammographic images were processed with CAD.

Targeted ultrasound is performed, showing an oval, circumscribed
hypoechoic mass at the [DATE] position 6 cm from the nipple. It
demonstrates a single thin internal septation or possibly a cluster
of micro cystic foci. It measures 4 x 3 x 3 mm. There is no internal
vascularity. This correlates well with the mammographic finding.
IMPRESSION: Probably benign, probable left breast complicated cyst versus
clustered microcysts. Recommendation is for short-term imaging
follow-up.

RECOMMENDATION:
Diagnostic left breast mammogram and ultrasound in 6 months.

I have discussed the findings and recommendations with the patient.
If applicable, a reminder letter will be sent to the patient
regarding the next appointment.

BI-RADS CATEGORY  3: Probably benign.

## 2022-04-03 IMAGING — MG MM DIGITAL DIAGNOSTIC UNILAT*L* W/ TOMO W/ CAD
4 series · 4 of 12 positions shown · non-contrast
Comparison: 09/05/2020 and earlier

CLINICAL DATA: First six-month follow-up for probably benign mass
in the LEFT breast.

EXAM:
DIGITAL DIAGNOSTIC UNILATERAL LEFT MAMMOGRAM WITH TOMOSYNTHESIS AND
CAD; ULTRASOUND LEFT BREAST LIMITED
TECHNIQUE: Left digital diagnostic mammography and breast tomosynthesis was
performed. The images were evaluated with computer-aided detection.;
Targeted ultrasound examination of the left breast was performed

[L CC synth-2D]
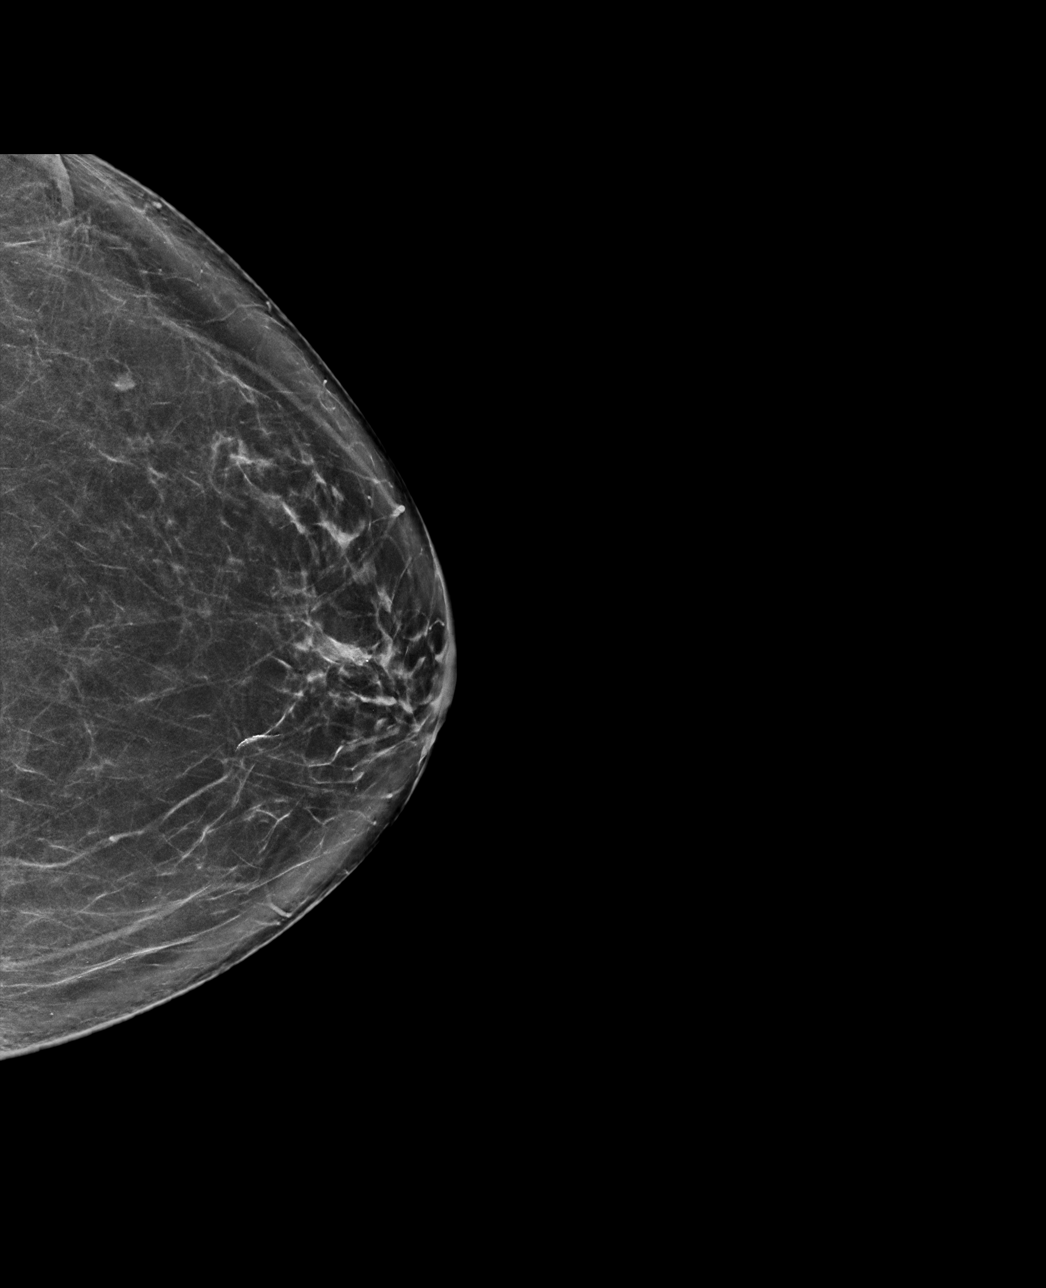

[L MLO synth-2D]
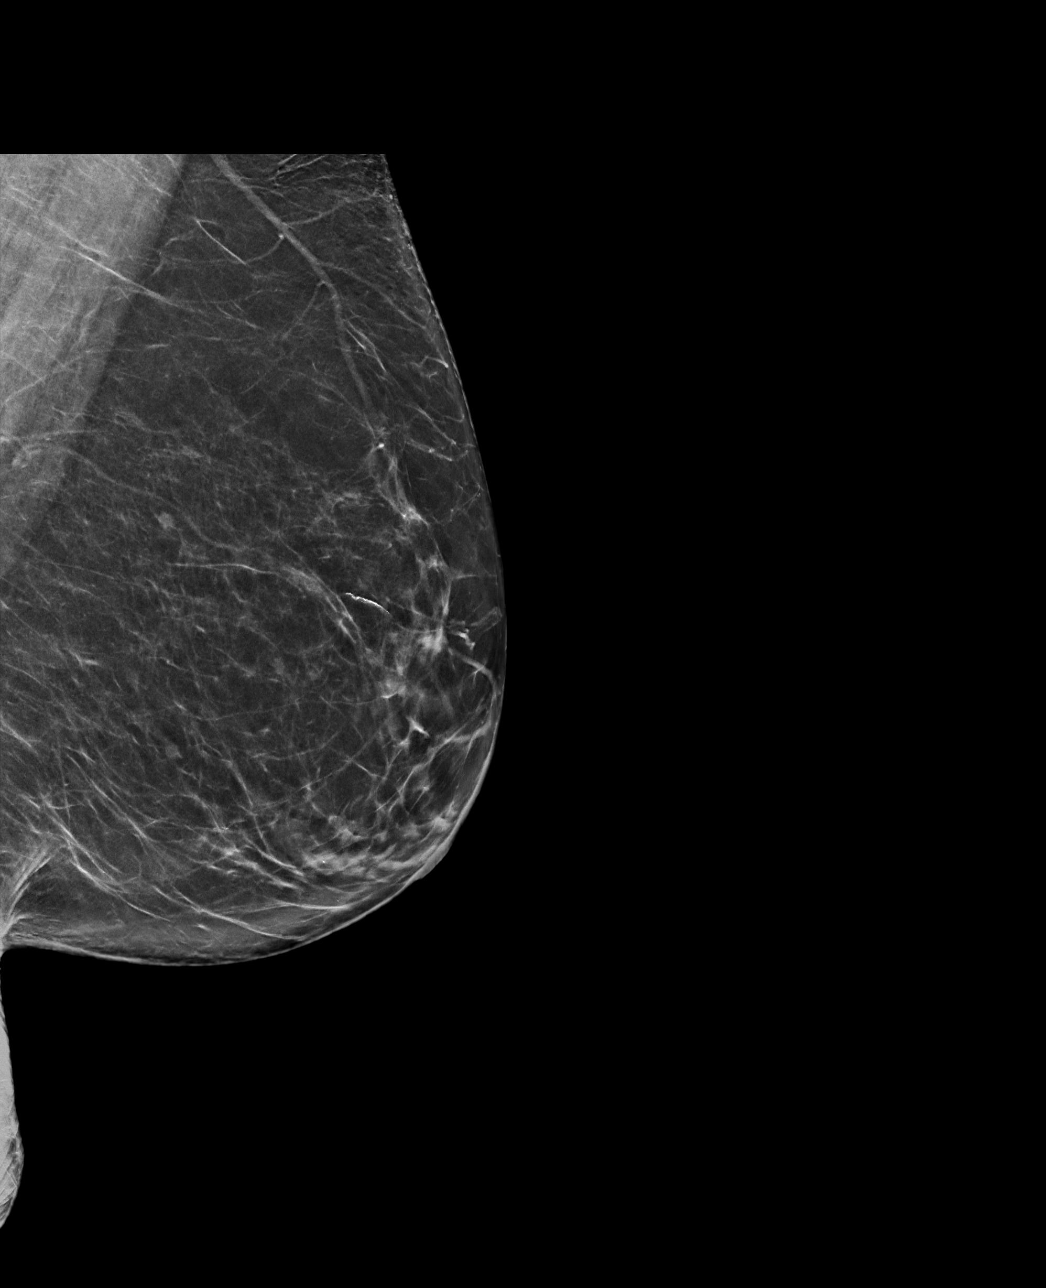

[L MLO tomo · tomo slice 38/75.0]
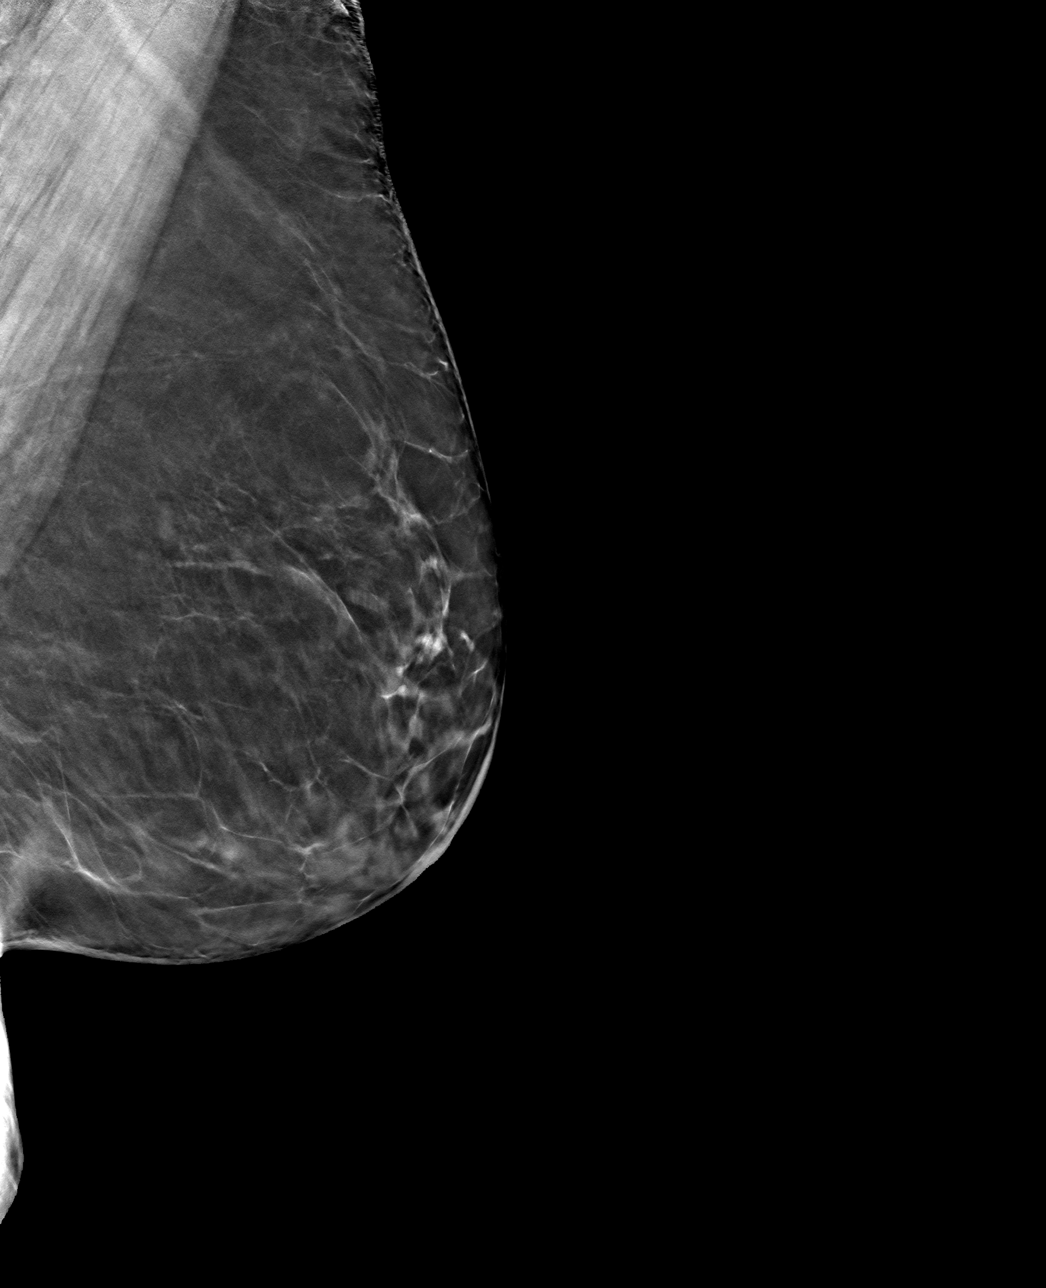

[L CC tomo · tomo slice 39/78.0]
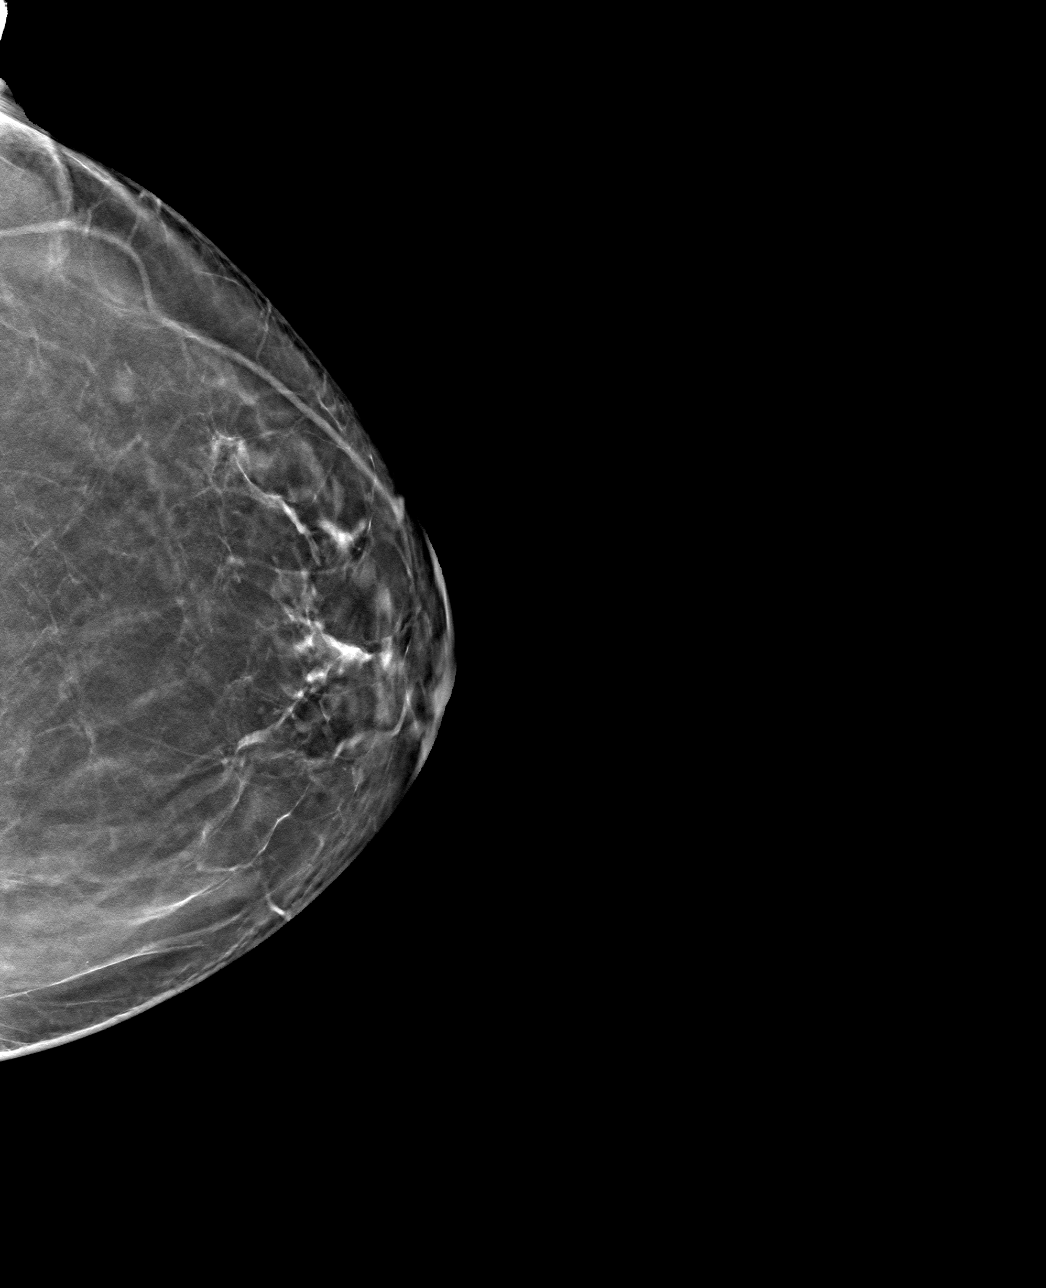

[4 of 12 positions shown; findings below may reference images not displayed]

ACR Breast Density Category b: There are scattered areas of
fibroglandular density.
FINDINGS: Small circumscribed oval mass in the LATERAL portion of the LEFT
breast appears stable. No new or suspicious findings.

Targeted ultrasound is performed, showing a circumscribed near
anechoic oval mass in the 2:30 o'clock location of the LEFT breast 6
centimeters from the nipple measuring 0.3 x 0.4 x 0.3 centimeters.
There is no internal blood flow on Doppler evaluation. The
appearance is stable.
IMPRESSION: Stable probably benign mass in the LEFT breast. No mammographic or
ultrasound evidence for malignancy.

RECOMMENDATION:
Bilateral diagnostic mammogram and LEFT breast ultrasound
recommended in 6 months.

I have discussed the findings and recommendations with the patient.
If applicable, a reminder letter will be sent to the patient
regarding the next appointment.

BI-RADS CATEGORY  3: Probably benign.

## 2022-07-20 ENCOUNTER — Other Ambulatory Visit: Payer: Self-pay | Admitting: Internal Medicine

## 2022-07-20 DIAGNOSIS — N6321 Unspecified lump in the left breast, upper outer quadrant: Secondary | ICD-10-CM

## 2022-09-02 ENCOUNTER — Ambulatory Visit
Admission: RE | Admit: 2022-09-02 | Discharge: 2022-09-02 | Disposition: A | Discharge status: Home / Self Care | Payer: Medicare Other | Source: Ambulatory Visit | Attending: Internal Medicine | Admitting: Internal Medicine

## 2022-09-02 DIAGNOSIS — N6321 Unspecified lump in the left breast, upper outer quadrant: Secondary | ICD-10-CM

## 2023-07-20 ENCOUNTER — Other Ambulatory Visit: Payer: Self-pay | Admitting: Internal Medicine

## 2023-07-20 DIAGNOSIS — Z1231 Encounter for screening mammogram for malignant neoplasm of breast: Secondary | ICD-10-CM

## 2023-09-06 ENCOUNTER — Ambulatory Visit
Admission: RE | Admit: 2023-09-06 | Discharge: 2023-09-06 | Disposition: A | Discharge status: Home / Self Care | Payer: Medicare Other | Source: Ambulatory Visit | Attending: Internal Medicine | Admitting: Internal Medicine

## 2023-09-06 DIAGNOSIS — Z1231 Encounter for screening mammogram for malignant neoplasm of breast: Secondary | ICD-10-CM

## 2024-01-03 ENCOUNTER — Ambulatory Visit: Payer: Medicare Other | Admitting: Podiatry

## 2024-01-03 DIAGNOSIS — M368 Systemic disorders of connective tissue in other diseases classified elsewhere: Secondary | ICD-10-CM | POA: Diagnosis not present

## 2024-01-03 DIAGNOSIS — T691XXA Chilblains, initial encounter: Secondary | ICD-10-CM

## 2024-01-03 DIAGNOSIS — I7300 Raynaud's syndrome without gangrene: Secondary | ICD-10-CM

## 2024-01-03 NOTE — Patient Instructions (Signed)
 VISIT SUMMARY:  Today, we discussed your ongoing issues with Raynaud's disease, venous insufficiency, lymphedema, and rheumatoid arthritis. We also reviewed the status of your foot ulcerations and swelling.  YOUR PLAN:  -RAYNAUD'S DISEASE: Raynaud's disease causes some areas of your body, such as your fingers and toes, to feel numb and cold in response to cold temperatures or stress. To help manage this, you should wear compression stockings followed by warm wool socks to keep your toes warm.  -VENOUS INSUFFICIENCY AND LYMPHEDEMA: Venous insufficiency occurs when your leg veins don't allow blood to flow back up to your heart properly, and lymphedema is swelling due to lymph fluid build-up. Continue wearing your compression stockings to help manage the swelling in your feet and ankles.  -RHEUMATOID ARTHRITIS: Rheumatoid arthritis is an autoimmune disorder that causes inflammation in your joints. Continue with your current rheumatoid arthritis management plan.  -WOUND HEALING: You have healing partial thickness ulcerations on your feet. Continue with your current wound care routine. We will follow up in 5 weeks to check on your progress.  -GENERAL HEALTH MAINTENANCE: Continue with your current medication regimen. If your wounds worsen or you notice a return of bad drainage, please contact our office immediately.  INSTRUCTIONS:  Please follow up in 5 weeks to assess the progress of your wound healing. If your wounds worsen or you notice a return of bad drainage, contact our office immediately.

## 2024-01-05 NOTE — Progress Notes (Signed)
  Subjective:  Patient ID: Cynthia Combs, female    DOB: Jan 18, 1941,  MRN: [id number]  Chief Complaint  Patient presents with   Wound Check    Pt stated that she scrapped her feet in the snow that we had a few weeks ago     Discussed the use of AI scribe software for clinical note transcription with the patient, who gave verbal consent to proceed.  History of Present Illness   Cynthia Combs is a 83 year old female with Raynaud's disease and rheumatoid arthritis who presents with foot ulcerations and swelling.  She has a history of Raynaud's disease affecting her hands and feet, characterized by daily color changes and numbness in her toes. Her toes change color daily, and she experiences numbness in her toes. She recalls an uncle with severe Raynaud's in his hands. On January 5th, she fell outside in the snow with her dog and was unable to get up, leading to extremely cold feet. EMS assisted her and wrapped her feet. During the incident, her feet turned blue and red, raising concerns about frostbite. She has noticed her feet have been cold and her toes numb for years.  She also has rheumatoid arthritis, contributing to swelling in her feet and ankles. She experiences persistent swelling even with compression hose and sometimes wonders if she has sprained or broken something due to the swelling. She wears compression stockings daily to manage venous insufficiency.  Her current medications include methotrexate for rheumatoid arthritis, metoprolol, simvastatin, and Synthroid. No diabetes.          Objective:    Physical Exam   EXTREMITIES: 2+ pitting edema bilaterally, symmetric. Varicose veins present. Palpable DP pulse, cyanotic toes improve with warming. Partial thickness ulcerations: lateral 5th met base right, medial 1st MTP left, lateral 5th met base left. No signs of infection, healing well.             Results          Assessment:   1. Chilblains, initial  encounter   2. 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  Assessment and Plan    Raynaud's Disease Noted cyanotic toes and numbness, likely secondary to Raynaud's disease. Discussed the importance of maintaining warm temperature in the toes to prevent worsening of symptoms and slow wound healing. -Wear compression stockings followed by warm wool socks to maintain warmth in the toes.  Venous Insufficiency and Lymphedema Chronic swelling in the feet and ankles, likely secondary to venous insufficiency and lymphedema. Noted pitting edema bilaterally. -Continue wearing compression stockings.  Rheumatoid Arthritis Chronic rheumatoid arthritis likely contributing to inflammation and swelling in the ankles. -Continue current rheumatoid arthritis management.  Wound Healing Noted healing partial thickness ulcerations on the lateral fifth metatarsal base on the right foot, medial first MTP on the left foot, and lateral fifth metatarsal base on the left foot. No signs of infection. -Continue current wound care. -Follow-up in 5 weeks to assess wound healing progress.  General Health Maintenance -Continue current medication regimen. -If wounds worsen or there is a return of bad drainage, patient to contact the office immediately.          Return in about 5 weeks (around 02/07/2024) for follow up raynaud's / chillblains.

## 2024-02-08 ENCOUNTER — Ambulatory Visit: Payer: Medicare Other | Admitting: Podiatry

## 2024-02-14 ENCOUNTER — Encounter: Payer: Self-pay | Admitting: Podiatry

## 2024-02-14 ENCOUNTER — Ambulatory Visit: Payer: Medicare Other | Admitting: Podiatry

## 2024-02-14 VITALS — Ht 73.0 in | Wt 206.0 lb

## 2024-02-14 DIAGNOSIS — M368 Systemic disorders of connective tissue in other diseases classified elsewhere: Secondary | ICD-10-CM

## 2024-02-14 DIAGNOSIS — T691XXA Chilblains, initial encounter: Secondary | ICD-10-CM

## 2024-02-14 DIAGNOSIS — I7300 Raynaud's syndrome without gangrene: Secondary | ICD-10-CM | POA: Diagnosis not present

## 2024-02-15 NOTE — Progress Notes (Signed)
  Subjective:  Patient ID: Cynthia Combs, female    DOB: 1941-03-27,  MRN: [id number]  Chief Complaint  Patient presents with   Foot Pain    follow up raynaud's / chillblains, pt states everything is fine.    83 y.o. female presents with the above complaint. History confirmed with patient. Doing much better  Objective:  Physical Exam: warm, good capillary refill, normal DP and PT pulses, normal sensory exam, and ulcers have healed, some edema and venous insufficiency still  Assessment:   1. Chilblains, initial encounter   2. 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  Discussed risk of recurrence and hopefully should be primarily seasonal. Continue warming of the feet. Return PRN if new ulcerations develop or any worsening  No follow-ups on file.

## 2024-07-24 ENCOUNTER — Other Ambulatory Visit: Payer: Self-pay | Admitting: Internal Medicine

## 2024-07-24 DIAGNOSIS — Z1231 Encounter for screening mammogram for malignant neoplasm of breast: Secondary | ICD-10-CM

## 2024-09-18 ENCOUNTER — Ambulatory Visit
Admission: RE | Admit: 2024-09-18 | Discharge: 2024-09-18 | Disposition: A | Discharge status: Home / Self Care | Source: Ambulatory Visit | Attending: Internal Medicine | Admitting: Internal Medicine

## 2024-09-18 DIAGNOSIS — Z1231 Encounter for screening mammogram for malignant neoplasm of breast: Secondary | ICD-10-CM
